# Patient Record
Sex: Female | Born: 1975 | Race: White | Hispanic: No | Smoking: Former smoker
Health system: Southern US, Community
[De-identification: ages and names within clinical notes are randomized; demographics above are authoritative.]

## PROBLEM LIST (undated history)

## (undated) DIAGNOSIS — B019 Varicella without complication: Secondary | ICD-10-CM

## (undated) DIAGNOSIS — T7840XA Allergy, unspecified, initial encounter: Secondary | ICD-10-CM

## (undated) HISTORY — PX: EYE SURGERY: SHX253

## (undated) HISTORY — DX: Allergy, unspecified, initial encounter: T78.40XA

## (undated) HISTORY — PX: COLONOSCOPY: SHX174

## (undated) HISTORY — DX: Varicella without complication: B01.9

## (undated) HISTORY — PX: BREAST BIOPSY: SHX20

---

## 2014-07-26 ENCOUNTER — Other Ambulatory Visit: Payer: Self-pay

## 2014-07-26 DIAGNOSIS — Z803 Family history of malignant neoplasm of breast: Secondary | ICD-10-CM

## 2014-07-26 DIAGNOSIS — Z1231 Encounter for screening mammogram for malignant neoplasm of breast: Secondary | ICD-10-CM

## 2014-08-02 DIAGNOSIS — J309 Allergic rhinitis, unspecified: Secondary | ICD-10-CM | POA: Insufficient documentation

## 2014-08-15 ENCOUNTER — Ambulatory Visit
Admission: RE | Admit: 2014-08-15 | Discharge: 2014-08-15 | Disposition: A | Discharge status: Home / Self Care | Source: Ambulatory Visit

## 2014-08-15 DIAGNOSIS — Z803 Family history of malignant neoplasm of breast: Secondary | ICD-10-CM

## 2014-08-15 DIAGNOSIS — Z1231 Encounter for screening mammogram for malignant neoplasm of breast: Secondary | ICD-10-CM

## 2016-02-23 ENCOUNTER — Other Ambulatory Visit: Payer: Self-pay

## 2016-02-23 DIAGNOSIS — Z1231 Encounter for screening mammogram for malignant neoplasm of breast: Secondary | ICD-10-CM

## 2016-03-12 ENCOUNTER — Other Ambulatory Visit: Payer: Self-pay

## 2016-03-12 ENCOUNTER — Ambulatory Visit
Admission: RE | Admit: 2016-03-12 | Discharge: 2016-03-12 | Disposition: A | Discharge status: Home / Self Care | Source: Ambulatory Visit

## 2016-03-12 DIAGNOSIS — N63 Unspecified lump in unspecified breast: Secondary | ICD-10-CM

## 2016-03-12 DIAGNOSIS — Z1231 Encounter for screening mammogram for malignant neoplasm of breast: Secondary | ICD-10-CM

## 2016-11-25 DIAGNOSIS — D225 Melanocytic nevi of trunk: Secondary | ICD-10-CM | POA: Insufficient documentation

## 2017-03-13 ENCOUNTER — Other Ambulatory Visit: Payer: Self-pay | Admitting: Family Medicine

## 2017-03-13 DIAGNOSIS — Z1231 Encounter for screening mammogram for malignant neoplasm of breast: Secondary | ICD-10-CM

## 2017-03-31 ENCOUNTER — Ambulatory Visit
Admission: RE | Admit: 2017-03-31 | Discharge: 2017-03-31 | Disposition: A | Discharge status: Home / Self Care | Source: Ambulatory Visit | Attending: Family Medicine | Admitting: Family Medicine

## 2017-03-31 DIAGNOSIS — Z1231 Encounter for screening mammogram for malignant neoplasm of breast: Secondary | ICD-10-CM

## 2018-03-13 ENCOUNTER — Other Ambulatory Visit: Payer: Self-pay | Admitting: Family Medicine

## 2018-03-13 DIAGNOSIS — Z1231 Encounter for screening mammogram for malignant neoplasm of breast: Secondary | ICD-10-CM

## 2018-04-06 ENCOUNTER — Ambulatory Visit
Admission: RE | Admit: 2018-04-06 | Discharge: 2018-04-06 | Disposition: A | Discharge status: Home / Self Care | Source: Ambulatory Visit | Attending: Family Medicine | Admitting: Family Medicine

## 2018-04-06 DIAGNOSIS — Z1231 Encounter for screening mammogram for malignant neoplasm of breast: Secondary | ICD-10-CM

## 2019-03-24 ENCOUNTER — Other Ambulatory Visit: Payer: Self-pay | Admitting: Family Medicine

## 2019-03-24 DIAGNOSIS — Z1231 Encounter for screening mammogram for malignant neoplasm of breast: Secondary | ICD-10-CM

## 2019-04-24 ENCOUNTER — Other Ambulatory Visit: Payer: Self-pay

## 2019-04-24 ENCOUNTER — Ambulatory Visit
Admission: RE | Admit: 2019-04-24 | Discharge: 2019-04-24 | Disposition: A | Discharge status: Home / Self Care | Source: Ambulatory Visit | Attending: Family Medicine | Admitting: Family Medicine

## 2019-04-24 DIAGNOSIS — Z1231 Encounter for screening mammogram for malignant neoplasm of breast: Secondary | ICD-10-CM

## 2019-10-29 HISTORY — PX: BREAST BIOPSY: SHX20

## 2020-03-21 ENCOUNTER — Telehealth: Payer: Self-pay | Admitting: Genetic Counselor

## 2020-03-21 NOTE — Telephone Encounter (Signed)
Returned Ms. Hoes's call regarding genetic counseling to test for a known familial BRCA variant. LVM requesting that she call back to set up an appointment.

## 2020-03-24 NOTE — Telephone Encounter (Addendum)
Called Monique Jefferson regarding her questions about genetic counseling and testing. Discussed that there may be a cost associated with both the genetic counseling appointment and the genetic testing. Reviewed the billing policies of the genetic testing laboratories that we use, specifically that it is possible to request an out of pocket estimate from the laboratory prior to ordering the genetic test to get a better idea of what she may have to pay for testing. Recommended that she speak with her insurance company regarding the possible costs associated with a genetic counseling appointment, and to determine if she would need a referral from her PCP.   Monique Jefferson will look into her insurance coverage/requirements for an appointment and will reach out again when she is ready to schedule an appointment.

## 2020-04-05 ENCOUNTER — Other Ambulatory Visit: Payer: Self-pay | Admitting: Family Medicine

## 2020-04-05 DIAGNOSIS — Z1231 Encounter for screening mammogram for malignant neoplasm of breast: Secondary | ICD-10-CM

## 2020-04-24 LAB — HM PAP SMEAR

## 2020-04-24 LAB — LIPID PANEL
Cholesterol: 182 (ref 0–200)
HDL: 79 — AB (ref 35–70)
LDL Cholesterol: 92
Triglycerides: 54 (ref 40–160)

## 2020-04-24 LAB — CBC: RBC: 4.76 (ref 3.87–5.11)

## 2020-04-24 LAB — CBC AND DIFFERENTIAL
HCT: 43 (ref 36–46)
Hemoglobin: 14.6 (ref 12.0–16.0)
Neutrophils Absolute: 3.6
Platelets: 217 10*3/uL (ref 150–400)
WBC: 5.9

## 2020-04-24 LAB — RESULTS CONSOLE HPV: CHL HPV: NEGATIVE

## 2020-04-25 ENCOUNTER — Other Ambulatory Visit: Payer: Self-pay

## 2020-04-25 ENCOUNTER — Ambulatory Visit

## 2020-04-25 ENCOUNTER — Ambulatory Visit
Admission: RE | Admit: 2020-04-25 | Discharge: 2020-04-25 | Disposition: A | Discharge status: Home / Self Care | Source: Ambulatory Visit | Attending: Family Medicine | Admitting: Family Medicine

## 2020-04-25 DIAGNOSIS — Z1231 Encounter for screening mammogram for malignant neoplasm of breast: Secondary | ICD-10-CM

## 2020-04-28 ENCOUNTER — Other Ambulatory Visit: Payer: Self-pay | Admitting: Family Medicine

## 2020-04-28 DIAGNOSIS — R928 Other abnormal and inconclusive findings on diagnostic imaging of breast: Secondary | ICD-10-CM

## 2020-05-03 ENCOUNTER — Other Ambulatory Visit: Payer: Self-pay

## 2020-05-03 ENCOUNTER — Other Ambulatory Visit: Payer: Self-pay | Admitting: Family Medicine

## 2020-05-03 ENCOUNTER — Ambulatory Visit
Admission: RE | Admit: 2020-05-03 | Discharge: 2020-05-03 | Disposition: A | Discharge status: Home / Self Care | Source: Ambulatory Visit | Attending: Family Medicine | Admitting: Family Medicine

## 2020-05-03 DIAGNOSIS — R928 Other abnormal and inconclusive findings on diagnostic imaging of breast: Secondary | ICD-10-CM

## 2020-05-03 DIAGNOSIS — R921 Mammographic calcification found on diagnostic imaging of breast: Secondary | ICD-10-CM

## 2020-05-09 ENCOUNTER — Other Ambulatory Visit: Payer: Self-pay

## 2020-05-09 ENCOUNTER — Ambulatory Visit
Admission: RE | Admit: 2020-05-09 | Discharge: 2020-05-09 | Disposition: A | Discharge status: Home / Self Care | Source: Ambulatory Visit | Attending: Family Medicine | Admitting: Family Medicine

## 2020-05-09 DIAGNOSIS — R921 Mammographic calcification found on diagnostic imaging of breast: Secondary | ICD-10-CM

## 2020-05-17 ENCOUNTER — Other Ambulatory Visit: Payer: Self-pay | Admitting: Family Medicine

## 2020-06-15 DIAGNOSIS — Z1379 Encounter for other screening for genetic and chromosomal anomalies: Secondary | ICD-10-CM | POA: Insufficient documentation

## 2021-01-25 DIAGNOSIS — IMO0002 Reserved for concepts with insufficient information to code with codable children: Secondary | ICD-10-CM

## 2021-01-25 DIAGNOSIS — Z961 Presence of intraocular lens: Secondary | ICD-10-CM

## 2021-01-25 DIAGNOSIS — M26609 Unspecified temporomandibular joint disorder, unspecified side: Secondary | ICD-10-CM | POA: Insufficient documentation

## 2021-01-25 HISTORY — DX: Reserved for concepts with insufficient information to code with codable children: IMO0002

## 2021-01-25 HISTORY — DX: Presence of intraocular lens: Z96.1

## 2021-05-01 ENCOUNTER — Other Ambulatory Visit: Payer: Self-pay | Admitting: Family Medicine

## 2021-05-01 DIAGNOSIS — Z1231 Encounter for screening mammogram for malignant neoplasm of breast: Secondary | ICD-10-CM

## 2021-05-09 ENCOUNTER — Other Ambulatory Visit: Payer: Self-pay

## 2021-05-09 ENCOUNTER — Ambulatory Visit
Admission: RE | Admit: 2021-05-09 | Discharge: 2021-05-09 | Disposition: A | Discharge status: Home / Self Care | Source: Ambulatory Visit | Attending: Family Medicine | Admitting: Family Medicine

## 2021-05-09 DIAGNOSIS — Z1231 Encounter for screening mammogram for malignant neoplasm of breast: Secondary | ICD-10-CM

## 2021-05-12 LAB — HM MAMMOGRAPHY

## 2021-05-14 ENCOUNTER — Other Ambulatory Visit: Payer: Self-pay | Admitting: Family Medicine

## 2021-05-14 DIAGNOSIS — R928 Other abnormal and inconclusive findings on diagnostic imaging of breast: Secondary | ICD-10-CM

## 2021-06-06 ENCOUNTER — Other Ambulatory Visit: Payer: Self-pay

## 2021-06-06 ENCOUNTER — Ambulatory Visit
Admission: RE | Admit: 2021-06-06 | Discharge: 2021-06-06 | Disposition: A | Discharge status: Home / Self Care | Source: Ambulatory Visit | Attending: Family Medicine | Admitting: Family Medicine

## 2021-06-06 DIAGNOSIS — R928 Other abnormal and inconclusive findings on diagnostic imaging of breast: Secondary | ICD-10-CM

## 2022-04-15 ENCOUNTER — Ambulatory Visit: Payer: 59 | Admitting: Family Medicine

## 2022-04-26 ENCOUNTER — Encounter: Payer: Self-pay | Admitting: Family Medicine

## 2022-05-02 ENCOUNTER — Ambulatory Visit (INDEPENDENT_AMBULATORY_CARE_PROVIDER_SITE_OTHER): Payer: 59 | Admitting: Family Medicine

## 2022-05-06 NOTE — Progress Notes (Signed)
Provider cancelled

## 2022-05-13 ENCOUNTER — Ambulatory Visit (INDEPENDENT_AMBULATORY_CARE_PROVIDER_SITE_OTHER): Payer: 59 | Admitting: Ophthalmology

## 2022-05-13 ENCOUNTER — Encounter (INDEPENDENT_AMBULATORY_CARE_PROVIDER_SITE_OTHER): Payer: Self-pay | Admitting: Ophthalmology

## 2022-05-13 DIAGNOSIS — H5213 Myopia, bilateral: Secondary | ICD-10-CM | POA: Insufficient documentation

## 2022-05-13 HISTORY — DX: Myopia, bilateral: H52.13

## 2022-05-13 NOTE — Assessment & Plan Note (Signed)
No holes or tears OU.  Myopia corrected by anterior segment ICL lenses placed posterior to the iris, Dr. Milbert Coulter of [facility]  No complications no lens cloudiness

## 2022-05-13 NOTE — Progress Notes (Signed)
05/13/2022     CHIEF COMPLAINT Patient presents for  Chief Complaint  Patient presents with   Retina Evaluation      HISTORY OF PRESENT ILLNESS: Monique Jefferson is a 46 y.o. female who presents to the clinic today for:   HPI     Retina Evaluation           Laterality: right eye   Associated Symptoms: Flashes and Floaters.  Negative for Distortion, Blind Spot, Pain, Redness, Photophobia, Glare, Trauma, Scalp Tenderness, Jaw Claudication, Shoulder/Hip pain, Fever, Weight Loss and Fatigue         Comments   NP- FU OCT OU (Last seen in 2018) Pt stated, "I do see FOL in the corner of my eyes, mostly in my left eye. My vision has been pretty well since my last visit. I had lens placed in my eyes so I would see FOL in the corners. I was seen with Dr. Zadie Rhine awhile back and I would see him pretty regularly but I was told that he doesn't accept [facility] so I had to search for another specialist. So I just wanted to follow up and make sure everything is okay." Pt confirms FOL and floaters in both eyes. Pt stated vision has been stable since last visit.  History of posterior chamber myopic corrective intraocular lenses anterior to the native lens, performed in [place] edited by Hurman Horn, MD on 05/13/2022 11:20 AM.      Referring physician: Curly Rim, MD [address] [place],  [place] [address]  HISTORICAL INFORMATION:   Selected notes from the [facility]: No current outpatient medications on file. (Ophthalmic Drugs)   No current facility-administered medications for this visit. (Ophthalmic Drugs)   No current outpatient medications on file. (Other)   No current facility-administered medications for this visit. (Other)      REVIEW OF SYSTEMS: ROS   Negative for: Constitutional, Gastrointestinal, Neurological, Skin, Genitourinary, Musculoskeletal, HENT, Endocrine, Cardiovascular, Eyes,  Respiratory, Psychiatric, Allergic/Imm, Heme/Lymph Last edited by Silvestre Moment on 05/13/2022 10:21 AM.       ALLERGIES Allergies  Allergen Reactions   Penicillins Swelling    Throat; as a child   Sulfa Antibiotics Rash    PAST MEDICAL HISTORY Past Medical History:  Diagnosis Date   Chicken pox    Past Surgical History:  Procedure Laterality Date   BREAST BIOPSY      FAMILY HISTORY Family History  Problem Relation Age of Onset   Hyperlipidemia Mother    Breast cancer Mother    Hypertension Father    Hyperlipidemia Father    Osteoarthritis Father    Cancer Father    Hearing loss Father    Cancer Brother    Cancer Maternal Grandmother    Early death Maternal Grandfather    Cancer Maternal Grandfather    Early death Paternal Grandmother    Hypertension Paternal Grandfather    Hyperlipidemia Paternal Grandfather    Heart attack Paternal Grandfather     SOCIAL HISTORY Social History   Tobacco Use   Smoking status: Former    Types: Cigarettes   Smokeless tobacco: Never  Substance Use Topics   Alcohol use: Not Currently   Drug use: Not Currently         OPHTHALMIC EXAM:  Base Eye Exam     Visual Acuity (ETDRS)  Right Left   Dist Datil 20/25 -1 20/20         Tonometry (Tonopen, 10:25 AM)       Right Left   Pressure 16 15         Pupils       Pupils APD   Right PERRL None   Left PERRL None         Visual Fields       Left Right    Full Full         Extraocular Movement       Right Left    Full Full         Neuro/Psych     Oriented x3: Yes         Dilation     Both eyes: 1.0% Mydriacyl, 2.5% Phenylephrine @ 10:25 AM           Slit Lamp and Fundus Exam     External Exam       Right Left   External Normal Normal         Slit Lamp Exam       Right Left   Lids/Lashes Normal Normal   Conjunctiva/Sclera White and quiet White and quiet   Cornea Clear Clear   Anterior Chamber Deep and quiet Deep and quiet    Iris Round and reactive Round and reactive   Lens Clear, with posterior chamber ICL in place Clear, with posterior chamber ICL in place, anterior to lens   Anterior Vitreous Normal Normal         Fundus Exam       Right Left   Posterior Vitreous Normal Normal   Disc Normal Normal   C/D Ratio 0.2 0.2   Macula Normal Normal   Vessels Normal Normal   Periphery Normal no holes or tears, 20 D, 25 D90 D exams Normal no holes or tears, 20 D, 25 D90 D exams            IMAGING AND PROCEDURES  Imaging and Procedures for 05/13/22  OCT, Retina - OU - Both Eyes       Right Eye Quality was good. Scan locations included subfoveal. Central Foveal Thickness: 303. Findings include normal foveal contour.   Left Eye Quality was good. Scan locations included subfoveal. Central Foveal Thickness: 298. Progression has been stable. Findings include normal foveal contour.      Color Fundus Photography Optos - OU - Both Eyes       Right Eye Progression has no prior data. Disc findings include normal observations. Macula : normal observations. Vessels : normal observations. Periphery : normal observations.   Left Eye Progression has no prior data. Disc findings include normal observations. Macula : normal observations. Vessels : normal observations. Periphery : normal observations.              ASSESSMENT/PLAN:  High myopia, both eyes No holes or tears OU.  Myopia corrected by anterior segment ICL lenses placed posterior to the iris, Dr. Milbert Coulter of [facility]  No complications no lens cloudiness       ICD-10-CM   1. High myopia, both eyes  H52.13 OCT, Retina - OU - Both Eyes    Color Fundus Photography Optos - OU - Both Eyes      1.  No retinal holes or tears OU  2.  3.  Ophthalmic Meds Ordered this visit:  No orders of the defined types were placed in this encounter.  Return in about 2 years (around 05/13/2024) for DILATE OU, COLOR FP, OCT.  There are no  Patient Instructions on file for this visit.   Explained the diagnoses, plan, and follow up with the patient and they expressed understanding.  Patient expressed understanding of the importance of proper follow up care.   Clent Demark Lorianne Malbrough M.D. Diseases & Surgery of the Retina and Vitreous Retina & Diabetic [place] 05/13/22     Abbreviations: M myopia (nearsighted); A astigmatism; H hyperopia (farsighted); P presbyopia; Mrx spectacle prescription;  CTL contact lenses; OD right eye; OS left eye; OU both eyes  XT exotropia; ET esotropia; PEK punctate epithelial keratitis; PEE punctate epithelial erosions; DES dry eye syndrome; MGD meibomian gland dysfunction; ATs artificial tears; PFAT's preservative free artificial tears; [place] nuclear sclerotic cataract; PSC posterior subcapsular cataract; ERM epi-retinal membrane; PVD posterior vitreous detachment; RD retinal detachment; DM diabetes mellitus; DR diabetic retinopathy; NPDR non-proliferative diabetic retinopathy; PDR proliferative diabetic retinopathy; CSME clinically significant macular edema; DME diabetic macular edema; dbh dot blot hemorrhages; CWS cotton wool spot; POAG primary open angle glaucoma; C/D cup-to-disc ratio; HVF humphrey visual field; GVF goldmann visual field; OCT optical coherence tomography; IOP intraocular pressure; BRVO Branch retinal vein occlusion; CRVO central retinal vein occlusion; CRAO central retinal artery occlusion; BRAO branch retinal artery occlusion; RT retinal tear; SB scleral buckle; PPV pars plana vitrectomy; VH Vitreous hemorrhage; PRP panretinal laser photocoagulation; IVK intravitreal kenalog; VMT vitreomacular traction; MH Macular hole;  NVD neovascularization of the disc; NVE neovascularization elsewhere; AREDS age related eye disease study; ARMD age related macular degeneration; POAG primary open angle glaucoma; EBMD epithelial/anterior basement membrane dystrophy; ACIOL anterior chamber intraocular lens; IOL  intraocular lens; PCIOL posterior chamber intraocular lens; Phaco/IOL phacoemulsification with intraocular lens placement; [place] photorefractive keratectomy; LASIK laser assisted in situ keratomileusis; HTN hypertension; DM diabetes mellitus; COPD chronic obstructive pulmonary disease

## 2022-05-17 ENCOUNTER — Encounter: Payer: Self-pay | Admitting: Family Medicine

## 2022-05-17 ENCOUNTER — Ambulatory Visit (INDEPENDENT_AMBULATORY_CARE_PROVIDER_SITE_OTHER): Payer: 59 | Admitting: Family Medicine

## 2022-05-17 VITALS — BP 116/83 | HR 87 | Temp 98.9°F | Ht 67.91 in | Wt 138.0 lb

## 2022-05-17 DIAGNOSIS — Z0000 Encounter for general adult medical examination without abnormal findings: Secondary | ICD-10-CM

## 2022-05-17 DIAGNOSIS — Z800 Family history of malignant neoplasm of digestive organs: Secondary | ICD-10-CM | POA: Diagnosis not present

## 2022-05-17 DIAGNOSIS — L989 Disorder of the skin and subcutaneous tissue, unspecified: Secondary | ICD-10-CM | POA: Diagnosis not present

## 2022-05-17 DIAGNOSIS — Z8249 Family history of ischemic heart disease and other diseases of the circulatory system: Secondary | ICD-10-CM

## 2022-05-17 DIAGNOSIS — Z1211 Encounter for screening for malignant neoplasm of colon: Secondary | ICD-10-CM | POA: Diagnosis not present

## 2022-05-17 DIAGNOSIS — Z130 Encounter for screening for diseases of the blood and blood-forming organs and certain disorders involving the immune mechanism: Secondary | ICD-10-CM

## 2022-05-17 DIAGNOSIS — Z7689 Persons encountering health services in other specified circumstances: Secondary | ICD-10-CM | POA: Diagnosis not present

## 2022-05-17 DIAGNOSIS — Z13220 Encounter for screening for lipoid disorders: Secondary | ICD-10-CM

## 2022-05-17 DIAGNOSIS — Z1231 Encounter for screening mammogram for malignant neoplasm of breast: Secondary | ICD-10-CM

## 2022-05-17 DIAGNOSIS — Z131 Encounter for screening for diabetes mellitus: Secondary | ICD-10-CM

## 2022-05-17 LAB — CBC WITH DIFFERENTIAL/PLATELET
Basophils Absolute: 0.1 10*3/uL (ref 0.0–0.1)
Basophils Relative: 1.1 % (ref 0.0–3.0)
Eosinophils Absolute: 0.1 10*3/uL (ref 0.0–0.7)
Eosinophils Relative: 2.6 % (ref 0.0–5.0)
HCT: 40.7 % (ref 36.0–46.0)
Hemoglobin: 13.6 g/dL (ref 12.0–15.0)
Lymphocytes Relative: 28.9 % (ref 12.0–46.0)
Lymphs Abs: 1.6 10*3/uL (ref 0.7–4.0)
MCHC: 33.5 g/dL (ref 30.0–36.0)
MCV: 92.4 fl (ref 78.0–100.0)
Monocytes Absolute: 0.5 10*3/uL (ref 0.1–1.0)
Monocytes Relative: 9.1 % (ref 3.0–12.0)
Neutro Abs: 3.2 10*3/uL (ref 1.4–7.7)
Neutrophils Relative %: 58.3 % (ref 43.0–77.0)
Platelets: 216 10*3/uL (ref 150.0–400.0)
RBC: 4.41 Mil/uL (ref 3.87–5.11)
RDW: 13.2 % (ref 11.5–15.5)
WBC: 5.6 10*3/uL (ref 4.0–10.5)

## 2022-05-17 LAB — COMPREHENSIVE METABOLIC PANEL
ALT: 10 U/L (ref 0–35)
AST: 15 U/L (ref 0–37)
Albumin: 4.5 g/dL (ref 3.5–5.2)
Alkaline Phosphatase: 60 U/L (ref 39–117)
BUN: 18 mg/dL (ref 6–23)
CO2: 29 mEq/L (ref 19–32)
Calcium: 9.3 mg/dL (ref 8.4–10.5)
Chloride: 103 mEq/L (ref 96–112)
Creatinine, Ser: 0.86 mg/dL (ref 0.40–1.20)
GFR: 81.4 mL/min (ref >60.00)
Glucose, Bld: 76 mg/dL (ref 70–99)
Potassium: 4.1 mEq/L (ref 3.5–5.1)
Sodium: 139 mEq/L (ref 135–145)
Total Bilirubin: 0.6 mg/dL (ref 0.2–1.2)
Total Protein: 6.9 g/dL (ref 6.0–8.3)

## 2022-05-17 LAB — LIPID PANEL
Cholesterol: 188 mg/dL (ref 0–200)
HDL: 67.9 mg/dL (ref >39.00)
LDL Cholesterol: 101 mg/dL — ABNORMAL HIGH (ref 0–99)
NonHDL: 120.1
Total CHOL/HDL Ratio: 3
Triglycerides: 97 mg/dL (ref 0.0–149.0)
VLDL: 19.4 mg/dL (ref 0.0–40.0)

## 2022-05-17 LAB — HEMOGLOBIN A1C: Hgb A1c MFr Bld: 5.6 % (ref 4.6–6.5)

## 2022-05-17 NOTE — Progress Notes (Signed)
Patient ID: Monique Jefferson, female  DOB: August 04, 1976, 46 y.o.   MRN: [id number] Patient Care Team    Relationship Specialty Notifications Start End  Ma Hillock, DO PCP - General Family Medicine  05/13/22   Zadie Rhine Clent Demark, MD Consulting Physician Ophthalmology  05/17/22     Chief Complaint  Patient presents with   Annual Exam    Pt is concerned about age spot on face but prefers to do CPE;ok to sch sep appt. Pt is not fasting    Subjective:  Monique Jefferson is a 46 y.o.  female present for new patient establishment. All past medical history, surgical history, allergies, family history, immunizations, medications and social history were updated in the electronic medical record today. All recent labs, ED visits and hospitalizations within the last year were reviewed.  Health maintenance:  Colonoscopy: Never.  Family history of colon cancer in her father.  Referral to gastroenterology placed today  Mammogram: Due.  Patient has had mammograms of [place].  She has had cysts and required repeat mammograms with ultrasounds the last 2 years-benign.  Order placed today.   Cervical cancer screening: last pap: 2021, results: Normal with negative cotest, completed by: PCP, 5 years Immunizations: tdap 05/2017, Influenza (encouraged yearly), COVID declined Infectious disease screening: HIV declined, Hep C declined DEXA: Routine screening  Assistive device: none Oxygen BTD:HRCB Patient has a Dental home. Hospitalizations/ED visits:     05/17/2022   10:23 AM  Depression screen PHQ 2/9  Decreased Interest 0  Down, Depressed, Hopeless 0  PHQ - 2 Score 0  Altered sleeping 0  Tired, decreased energy 0  Change in appetite 0  Feeling bad or failure about yourself  0  Trouble concentrating 0  Moving slowly or fidgety/restless 0  Suicidal thoughts 0  PHQ-9 Score 0  Difficult doing work/chores Not difficult at all      05/17/2022   10:24 AM  GAD 7 : Generalized  Anxiety Score  Nervous, Anxious, on Edge 0  Control/stop worrying 0  Worry too much - different things 0  Trouble relaxing 0  Restless 0  Easily annoyed or irritable 0  Afraid - awful might happen 0  Total GAD 7 Score 0  Anxiety Difficulty Not difficult at all           No data to display           Immunization History  Administered Date(s) Administered   Influenza Inj Mdck Quad Pf 09/02/2017   Influenza Nasal 07/28/2012   Influenza Split 08/25/2015   Influenza Whole 09/06/2011   Influenza, Seasonal, Injecte, Preservative Fre 08/12/2019   Influenza,Quad,Nasal, Live 07/28/2012   Influenza,inj,Quad PF,6-35 Mos 08/12/2019   Influenza,inj,quad, With Preservative 08/14/2018   Influenza-Unspecified 09/01/2014, 07/31/2016   PPD Test 08/31/2014   Tdap 06/24/2017    No results found.  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 01/25/2021   Allergies  Allergen Reactions   Penicillins Swelling    Throat; as a child   Sulfa Antibiotics Rash   Past Surgical History:  Procedure Laterality Date   BREAST BIOPSY  2021   Family History  Problem Relation Age of Onset   Hyperlipidemia Mother    Breast cancer Mother  Esophageal cancer Father 67   Hypertension Father    Hyperlipidemia Father    Osteoarthritis Father    Hearing loss Father    Prostate cancer Father 27   Prostate cancer Brother 11   Cervical cancer Maternal Grandmother    Early death Maternal Grandfather    Colon cancer Maternal Grandfather 46   Early death Paternal Grandmother    Hypertension Paternal Grandfather    Hyperlipidemia Paternal Grandfather    Heart attack Paternal Grandfather    Social History   Social History Narrative   Marital status/children/pets: Single (D),  G2P2   Education/employment: Employed, bachelor's of art degree.  Works as a [employer] of [employer].   Safety:      -smoke alarm in the home:Yes     - wears seatbelt: Yes     - Feels safe in their relationships: Yes       Allergies as of 05/17/2022       Reactions   Penicillins Swelling   Throat; as a child   Sulfa Antibiotics Rash        Medication List    as of May 17, 2022  5:57 PM   You have not been prescribed any medications.     All past medical history, surgical history, allergies, family history, immunizations andmedications were updated in the EMR today and reviewed under the history and medication portions of their EMR.    Recent Results (from the past 2160 hour(s))  CBC w/Diff     Status: None   Collection Time: 05/17/22 11:23 AM  Result Value Ref Range   WBC 5.6 4.0 - 10.5 K/uL   RBC 4.41 3.87 - 5.11 Mil/uL   Hemoglobin 13.6 12.0 - 15.0 g/dL   HCT 40.7 36.0 - 46.0 %   MCV 92.4 78.0 - 100.0 fl   MCHC 33.5 30.0 - 36.0 g/dL   RDW 13.2 11.5 - 15.5 %   Platelets 216.0 150.0 - 400.0 K/uL   Neutrophils Relative % 58.3 43.0 - 77.0 %   Lymphocytes Relative 28.9 12.0 - 46.0 %   Monocytes Relative 9.1 3.0 - 12.0 %   Eosinophils Relative 2.6 0.0 - 5.0 %   Basophils Relative 1.1 0.0 - 3.0 %   Neutro Abs 3.2 1.4 - 7.7 K/uL   Lymphs Abs 1.6 0.7 - 4.0 K/uL   Monocytes Absolute 0.5 0.1 - 1.0 K/uL   Eosinophils Absolute 0.1 0.0 - 0.7 K/uL   Basophils Absolute 0.1 0.0 - 0.1 K/uL  Comp Met (CMET)     Status: None   Collection Time: 05/17/22 11:23 AM  Result Value Ref Range   Sodium 139 135 - 145 mEq/L   Potassium 4.1 3.5 - 5.1 mEq/L   Chloride 103 96 - 112 mEq/L   CO2 29 19 - 32 mEq/L   Glucose, Bld 76 70 - 99 mg/dL   BUN 18 6 - 23 mg/dL   Creatinine, Ser 0.86 0.40 - 1.20 mg/dL   Total Bilirubin 0.6 0.2 - 1.2 mg/dL   Alkaline Phosphatase 60 39 - 117 U/L   AST 15 0 - 37 U/L   ALT 10 0 - 35 U/L   Total Protein 6.9 6.0 - 8.3 g/dL   Albumin 4.5  3.5 - 5.2 g/dL   GFR 81.40 >60.00 mL/min    Comment: Calculated using the CKD-EPI Creatinine Equation (2021)   Calcium 9.3 8.4 - 10.5 mg/dL  Hemoglobin A1c     Status: None   Collection Time: 05/17/22  11:23 AM  Result Value Ref Range   Hgb A1c MFr Bld 5.6 4.6 - 6.5 %    Comment: Glycemic Control Guidelines for People with Diabetes:Non Diabetic:  <6%Goal of Therapy: <7%Additional Action Suggested:  >8%   Lipid panel     Status: Abnormal   Collection Time: 05/17/22 11:23 AM  Result Value Ref Range   Cholesterol 188 0 - 200 mg/dL    Comment: ATP III Classification       Desirable:  < 200 mg/dL               Borderline High:  200 - 239 mg/dL          High:  > = 240 mg/dL   Triglycerides 97.0 0.0 - 149.0 mg/dL    Comment: Normal:  <150 mg/dLBorderline High:  150 - 199 mg/dL   HDL 67.90 >39.00 mg/dL   VLDL 19.4 0.0 - 40.0 mg/dL   LDL Cholesterol 101 (H) 0 - 99 mg/dL   Total CHOL/HDL Ratio 3     Comment:                Men          Women1/2 Average Risk     3.4          3.3Average Risk          5.0          4.42X Average Risk          9.6          7.13X Average Risk          15.0          11.0                       NonHDL 120.10     Comment: NOTE:  Non-HDL goal should be 30 mg/dL higher than patient's LDL goal (i.e. LDL goal of < 70 mg/dL, would have non-HDL goal of < 100 mg/dL)    MM DIAG BREAST TOMO UNI LEFT  Result Date: 06/06/2021 CLINICAL DATA:  Patient returns today to evaluate a possible LEFT breast mass identified on recent screening mammogram. History of benign LEFT breast biopsy in 2021 with pathology result of benign fibrocystic change, adenosis and PASH. EXAM: DIGITAL DIAGNOSTIC UNILATERAL LEFT MAMMOGRAM WITH TOMOSYNTHESIS AND CAD; ULTRASOUND LEFT BREAST LIMITED TECHNIQUE: Left digital diagnostic mammography and breast tomosynthesis was performed. The images were evaluated with computer-aided detection.; Targeted ultrasound examination of the left breast was performed. COMPARISON:   Previous exams including recent screening mammogram dated 05/09/2021. ACR Breast Density Category c: The breast tissue is heterogeneously dense, which may obscure small masses. FINDINGS: On today's additional diagnostic views with spot compression and 3D tomosynthesis, an oval circumscribed mass is confirmed within the upper LEFT breast, 11-12 o'clock axis region, at posterior depth, measuring approximately 8 mm greatest dimension. Targeted ultrasound is performed, showing a benign cyst within the LEFT breast at the 11 o'clock axis, 5 cm from the nipple, measuring 7 x 4 x 5 mm, corresponding to the mammographic finding. No suspicious solid or cystic mass is identified within the upper LEFT breast by ultrasound. There is an additional benign cyst in the LEFT breast at the 2 o'clock axis, 1 cm from the nipple, measuring 6 mm, corresponding as an incidental finding IMPRESSION: No evidence of malignancy. Benign cyst within the LEFT breast at the 11 o'clock axis, measuring 7 mm, corresponding to the mammographic finding. RECOMMENDATION: Screening  mammogram in one year.(Code:SM-B-01Y) I have discussed the findings and recommendations with the patient. If applicable, a reminder letter will be sent to the patient regarding the next appointment. BI-RADS CATEGORY  2: Benign. Electronically Signed   By: Franki Cabot M.D.   On: 06/06/2021 11:30   US BREAST LTD UNI LEFT INC AXILLA  Result Date: 06/06/2021 CLINICAL DATA:  Patient returns today to evaluate a possible LEFT breast mass identified on recent screening mammogram. History of benign LEFT breast biopsy in 2021 with pathology result of benign fibrocystic change, adenosis and PASH. EXAM: DIGITAL DIAGNOSTIC UNILATERAL LEFT MAMMOGRAM WITH TOMOSYNTHESIS AND CAD; ULTRASOUND LEFT BREAST LIMITED TECHNIQUE: Left digital diagnostic mammography and breast tomosynthesis was performed. The images were evaluated with computer-aided detection.; Targeted ultrasound examination of  the left breast was performed. COMPARISON:  Previous exams including recent screening mammogram dated 05/09/2021. ACR Breast Density Category c: The breast tissue is heterogeneously dense, which may obscure small masses. FINDINGS: On today's additional diagnostic views with spot compression and 3D tomosynthesis, an oval circumscribed mass is confirmed within the upper LEFT breast, 11-12 o'clock axis region, at posterior depth, measuring approximately 8 mm greatest dimension. Targeted ultrasound is performed, showing a benign cyst within the LEFT breast at the 11 o'clock axis, 5 cm from the nipple, measuring 7 x 4 x 5 mm, corresponding to the mammographic finding. No suspicious solid or cystic mass is identified within the upper LEFT breast by ultrasound. There is an additional benign cyst in the LEFT breast at the 2 o'clock axis, 1 cm from the nipple, measuring 6 mm, corresponding as an incidental finding IMPRESSION: No evidence of malignancy. Benign cyst within the LEFT breast at the 11 o'clock axis, measuring 7 mm, corresponding to the mammographic finding. RECOMMENDATION: Screening mammogram in one year.(Code:SM-B-01Y) I have discussed the findings and recommendations with the patient. If applicable, a reminder letter will be sent to the patient regarding the next appointment. BI-RADS CATEGORY  2: Benign. Electronically Signed   By: Franki Cabot M.D.   On: 06/06/2021 11:30    ROS 14 pt review of systems performed and negative (unless mentioned in an HPI)  Objective: BP 116/83   Pulse 87   Temp 98.9 F (37.2 C)   Ht 5' 7.91" (1.725 m)   Wt 138 lb (62.6 kg)   LMP 05/11/2022 (Approximate)   SpO2 100%   BMI 21.04 kg/m  Physical Exam Vitals and nursing note reviewed.  Constitutional:      General: She is not in acute distress.    Appearance: Normal appearance. She is not ill-appearing or toxic-appearing.  HENT:     Head: Normocephalic and atraumatic.     Right Ear: Tympanic membrane, ear  canal and external ear normal. There is no impacted cerumen.     Left Ear: Tympanic membrane, ear canal and external ear normal. There is no impacted cerumen.     Nose: No congestion or rhinorrhea.     Mouth/Throat:     Mouth: Mucous membranes are moist.     Pharynx: Oropharynx is clear. No oropharyngeal exudate or posterior oropharyngeal erythema.  Eyes:     General: No scleral icterus.       Right eye: No discharge.        Left eye: No discharge.     Extraocular Movements: Extraocular movements intact.     Conjunctiva/sclera: Conjunctivae normal.     Pupils: Pupils are equal, round, and reactive to light.  Cardiovascular:     Rate and  Rhythm: Normal rate and regular rhythm.     Pulses: Normal pulses.     Heart sounds: Normal heart sounds. No murmur heard.    No friction rub. No gallop.  Pulmonary:     Effort: Pulmonary effort is normal. No respiratory distress.     Breath sounds: Normal breath sounds. No stridor. No wheezing, rhonchi or rales.  Chest:     Chest wall: No tenderness.  Abdominal:     General: Abdomen is flat. Bowel sounds are normal. There is no distension.     Palpations: Abdomen is soft. There is no mass.     Tenderness: There is no abdominal tenderness. There is no right CVA tenderness, left CVA tenderness, guarding or rebound.     Hernia: No hernia is present.  Musculoskeletal:        General: No swelling, tenderness or deformity. Normal range of motion.     Cervical back: Normal range of motion and neck supple. No rigidity or tenderness.     Right lower leg: No edema.     Left lower leg: No edema.  Lymphadenopathy:     Cervical: No cervical adenopathy.  Skin:    General: Skin is warm and dry.     Coloration: Skin is not jaundiced or pale.     Findings: Lesion present. No bruising, erythema or rash.     Comments: 44mm mild raised lightly pigmented lesion right lateral zygomatic arch .  Neurological:     General: No focal deficit present.     Mental Status:  She is alert and oriented to person, place, and time. Mental status is at baseline.     Cranial Nerves: No cranial nerve deficit.     Sensory: No sensory deficit.     Motor: No weakness.     Coordination: Coordination normal.     Gait: Gait normal.     Deep Tendon Reflexes: Reflexes normal.  Psychiatric:        Mood and Affect: Mood normal.        Behavior: Behavior normal.        Thought Content: Thought content normal.        Judgment: Judgment normal.         Assessment/plan: Monique Jefferson is a 46 y.o. female present for est care- CPE Establishing care with new doctor, encounter for FH: heart disease/lipid screening Lipid panel collected today Screening for deficiency anemia - CBC w/Diff Diabetes mellitus screening - Comp Met (CMET) - Hemoglobin A1c FH: colon cancer/colon cancer screening Referral to gastroenterology today Breast cancer screening by mammogram - MM 3D SCREEN BREAST BILATERAL; Future Skin lesion: Macular papular lesion of face, may be undergoing some change. Dermatology referral placed today Routine general medical examination at a health care facility Colonoscopy: Never.  Family history of colon cancer in her father.  Referral to gastroenterology placed today  Mammogram: Due.  Patient has had mammograms of [place].  She has had cysts and required repeat mammograms with ultrasounds the last 2 years-benign.  Order placed today.   Cervical cancer screening: last pap: 2021, results: Normal with negative cotest, completed by: PCP, 5 years Immunizations: tdap 05/2017, Influenza (encouraged yearly), COVID declined Infectious disease screening: HIV declined, Hep C declined DEXA: Routine screening  Patient was encouraged to exercise greater than 150 minutes a week. Patient was encouraged to choose a diet filled with fresh fruits and vegetables, and lean meats. AVS provided to patient today for education/recommendation on gender specific health  and safety maintenance. Return in about 1 year (around 05/19/2023) for cpe (20 min).  Orders Placed This Encounter  Procedures   MM 3D SCREEN BREAST BILATERAL   CBC w/Diff   Comp Met (CMET)   Hemoglobin A1c   Lipid panel   Ambulatory referral to Gastroenterology   Ambulatory referral to Dermatology   No orders of the defined types were placed in this encounter.  Referral Orders         Ambulatory referral to Gastroenterology         Ambulatory referral to Dermatology       Note is dictated utilizing voice recognition software. Although note has been proof read prior to signing, occasional typographical errors still can be missed. If any questions arise, please do not hesitate to call for verification.  Electronically signed by: Howard Pouch, DO [facility]

## 2022-05-17 NOTE — Patient Instructions (Addendum)
No follow-ups on file.        Great to see you today.  I have refilled the medication(s) we provide.   If labs were collected, we will inform you of lab results once received either by echart message or telephone call.   - echart message- for normal results that have been seen by the patient already.   - telephone call: abnormal results or if patient has not viewed results in their echart.  [facility] Address:  [address], [place], [place] [address] Phone: [phone]    Health Maintenance, Female Adopting a healthy lifestyle and getting preventive care are important in promoting health and wellness. Ask your health care provider about: The right schedule for you to have regular tests and exams. Things you can do on your own to prevent diseases and keep yourself healthy. What should I know about diet, weight, and exercise? Eat a healthy diet  Eat a diet that includes plenty of vegetables, fruits, low-fat dairy products, and lean protein. Do not eat a lot of foods that are high in solid fats, added sugars, or sodium. Maintain a healthy weight Body mass index (BMI) is used to identify weight problems. It estimates body fat based on height and weight. Your health care provider can help determine your BMI and help you achieve or maintain a healthy weight. Get regular exercise Get regular exercise. This is one of the most important things you can do for your health. Most adults should: Exercise for at least 150 minutes each week. The exercise should increase your heart rate and make you sweat (moderate-intensity exercise). Do strengthening exercises at least twice a week. This is in addition to the moderate-intensity exercise. Spend less time sitting. Even light physical activity can be beneficial. Watch cholesterol and blood lipids Have your blood tested for lipids and cholesterol at 46 years of age, then have this test every 5 years. Have your cholesterol  levels checked more often if: Your lipid or cholesterol levels are high. You are older than 46 years of age. You are at high risk for heart disease. What should I know about cancer screening? Depending on your health history and family history, you may need to have cancer screening at various ages. This may include screening for: Breast cancer. Cervical cancer. Colorectal cancer. Skin cancer. Lung cancer. What should I know about heart disease, diabetes, and high blood pressure? Blood pressure and heart disease High blood pressure causes heart disease and increases the risk of stroke. This is more likely to develop in people who have high blood pressure readings or are overweight. Have your blood pressure checked: Every 3-5 years if you are 40-59 years of age. Every year if you are 74 years old or older. Diabetes Have regular diabetes screenings. This checks your fasting blood sugar level. Have the screening done: Once every three years after age 32 if you are at a normal weight and have a low risk for diabetes. More often and at a younger age if you are overweight or have a high risk for diabetes. What should I know about preventing infection? Hepatitis B If you have a higher risk for hepatitis B, you should be screened for this virus. Talk with your health care provider to find out if you are at risk for hepatitis B infection. Hepatitis C Testing is recommended for: Everyone born from [birth year] through 1965. Anyone with known risk factors for hepatitis C. Sexually transmitted infections (STIs) Get screened for STIs, including gonorrhea  and chlamydia, if: You are sexually active and are younger than 46 years of age. You are older than 46 years of age and your health care provider tells you that you are at risk for this type of infection. Your sexual activity has changed since you were last screened, and you are at increased risk for chlamydia or gonorrhea. Ask your health care provider if  you are at risk. Ask your health care provider about whether you are at high risk for HIV. Your health care provider may recommend a prescription medicine to help prevent HIV infection. If you choose to take medicine to prevent HIV, you should first get tested for HIV. You should then be tested every 3 months for as long as you are taking the medicine. Pregnancy If you are about to stop having your period (premenopausal) and you may become pregnant, seek counseling before you get pregnant. Take 400 to 800 micrograms (mcg) of folic acid every day if you become pregnant. Ask for birth control (contraception) if you want to prevent pregnancy. Osteoporosis and menopause Osteoporosis is a disease in which the bones lose minerals and strength with aging. This can result in bone fractures. If you are 21 years old or older, or if you are at risk for osteoporosis and fractures, ask your health care provider if you should: Be screened for bone loss. Take a calcium or vitamin D supplement to lower your risk of fractures. Be given hormone replacement therapy (HRT) to treat symptoms of menopause. Follow these instructions at home: Alcohol use Do not drink alcohol if: Your health care provider tells you not to drink. You are pregnant, may be pregnant, or are planning to become pregnant. If you drink alcohol: Limit how much you have to: 0-1 drink a day. Know how much alcohol is in your drink. In the [place] one drink equals one 12 oz bottle of beer (355 mL), one 5 oz glass of wine (148 mL), or one 1 oz glass of hard liquor (44 mL). Lifestyle Do not use any products that contain nicotine or tobacco. These products include cigarettes, chewing tobacco, and vaping devices, such as e-cigarettes. If you need help quitting, ask your health care provider. Do not use street drugs. Do not share needles. Ask your health care provider for help if you need support or information about quitting drugs. General  instructions Schedule regular health, dental, and eye exams. Stay current with your vaccines. Tell your health care provider if: You often feel depressed. You have ever been abused or do not feel safe at home. Summary Adopting a healthy lifestyle and getting preventive care are important in promoting health and wellness. Follow your health care provider's instructions about healthy diet, exercising, and getting tested or screened for diseases. Follow your health care provider's instructions on monitoring your cholesterol and blood pressure. This information is not intended to replace advice given to you by your health care provider. Make sure you discuss any questions you have with your health care provider. Document Revised: 03/05/2021 Document Reviewed: 03/05/2021 Elsevier Patient Education  [place].

## 2022-05-30 ENCOUNTER — Ambulatory Visit (AMBULATORY_SURGERY_CENTER): Payer: 59 | Admitting: *Deleted

## 2022-05-30 ENCOUNTER — Telehealth: Payer: Self-pay | Admitting: *Deleted

## 2022-05-30 VITALS — Ht 67.0 in | Wt 138.0 lb

## 2022-05-30 DIAGNOSIS — Z1211 Encounter for screening for malignant neoplasm of colon: Secondary | ICD-10-CM

## 2022-05-30 DIAGNOSIS — Z800 Family history of malignant neoplasm of digestive organs: Secondary | ICD-10-CM

## 2022-05-30 MED ORDER — NA SULFATE-K SULFATE-MG SULF 17.5-3.13-1.6 GM/177ML PO SOLN: 1.0000 | Freq: Once | ORAL | 0 refills | Status: AC

## 2022-05-30 NOTE — Telephone Encounter (Signed)
Attempted to contact pharmacy to verify if received rx for suprep x 4 disconnected each time, notified  pt. About issue and she is going to verify with pharmacy she stated "if I have any problems I will let you know.

## 2022-05-30 NOTE — Progress Notes (Signed)
No egg or soy allergy known to patient  No issues known to pt with past sedation with any surgeries or procedures Patient denies ever being told they had issues or difficulty with intubation  No FH of Malignant Hyperthermia Pt is not on diet pills Pt is not on  home 02  Pt is not on blood thinners  Pt denies issues with constipation  No A fib or A flutter Have any cardiac testing pending--no Pt instructed to use Singlecare.com or GoodRx for a price reduction on prep   

## 2022-05-31 ENCOUNTER — Encounter: Payer: Self-pay | Admitting: Certified Registered Nurse Anesthetist

## 2022-05-31 MED ORDER — NA SULFATE-K SULFATE-MG SULF 17.5-3.13-1.6 GM/177ML PO SOLN: 1.0000 | Freq: Once | ORAL | 0 refills | Status: AC

## 2022-05-31 NOTE — Telephone Encounter (Signed)
Prep sent into pharmacy per pt.request,made pt. Aware prep sent in.

## 2022-05-31 NOTE — Telephone Encounter (Signed)
Patient called stating that the reason that the pharmacy could not be reached was due to the power being out. Patient is wanting prep medication sent to:  [facility]  [phone]

## 2022-06-04 ENCOUNTER — Telehealth: Payer: Self-pay | Admitting: Gastroenterology

## 2022-06-04 ENCOUNTER — Encounter: Payer: Self-pay | Admitting: Gastroenterology

## 2022-06-04 NOTE — Telephone Encounter (Signed)
PT needs to have prep instructions re sent to her. Tried to show her where it would be in [facility] and said it isnt there. Please reach out to advise. Thank you.

## 2022-06-04 NOTE — Telephone Encounter (Signed)
Resent instructions to my chart- called pt and explained how to get to letters for instructions- pt verbalized understanding

## 2022-06-07 ENCOUNTER — Ambulatory Visit (AMBULATORY_SURGERY_CENTER): Payer: 59 | Admitting: Gastroenterology

## 2022-06-07 ENCOUNTER — Encounter: Payer: Self-pay | Admitting: Gastroenterology

## 2022-06-07 VITALS — BP 115/86 | HR 78 | Temp 98.6°F | Resp 12 | Ht 67.0 in | Wt 138.0 lb

## 2022-06-07 DIAGNOSIS — D122 Benign neoplasm of ascending colon: Secondary | ICD-10-CM

## 2022-06-07 DIAGNOSIS — D125 Benign neoplasm of sigmoid colon: Secondary | ICD-10-CM | POA: Diagnosis not present

## 2022-06-07 DIAGNOSIS — K635 Polyp of colon: Secondary | ICD-10-CM

## 2022-06-07 DIAGNOSIS — Z1211 Encounter for screening for malignant neoplasm of colon: Secondary | ICD-10-CM | POA: Diagnosis not present

## 2022-06-07 DIAGNOSIS — D123 Benign neoplasm of transverse colon: Secondary | ICD-10-CM

## 2022-06-07 MED ORDER — SODIUM CHLORIDE 0.9 % IV SOLN: 500.0000 mL | Freq: Once | INTRAVENOUS | Status: DC

## 2022-06-07 NOTE — Progress Notes (Unsigned)
Report given to PACU, vss 

## 2022-06-07 NOTE — Progress Notes (Unsigned)
Pt's states no medical or surgical changes since previsit or office visit. 

## 2022-06-07 NOTE — Progress Notes (Unsigned)
History and Physical:  This patient presents for endoscopic testing for: Encounter Diagnoses  Name Primary?   Family history of malignant neoplasm of gastrointestinal tract Yes   Special screening for malignant neoplasms, colon     Average risk - first screening exam. Patient denies chronic abdominal pain, rectal bleeding, constipation or diarrhea.   Patient is otherwise without complaints or active issues today.   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   Current Facility-Administered Medications  Medication Dose Route Frequency Provider Last Rate Last Admin   0.9 %  sodium chloride infusion  500 mL Intravenous Once Doran Stabler, MD          ___________________________________________________________________ Objective   Exam:  BP 123/89   Pulse 66   Temp 98.6 F (37 C)   Ht '5\' 7"'$  (1.702 m)   Wt 138 lb (62.6 kg)   LMP 05/11/2022 (Approximate)   SpO2 100%   BMI 21.61 kg/m   CV: RRR without murmur, S1/S2 Resp: clear to auscultation bilaterally, normal RR and effort noted GI: soft, no tenderness, with active bowel sounds.   Assessment: Encounter Diagnoses  Name Primary?   Family history of malignant neoplasm of gastrointestinal tract Yes   Special screening for malignant neoplasms, colon      Plan: Colonoscopy  The benefits and risks of the planned procedure were described  in detail with the patient or (when appropriate) their health care proxy.  Risks were outlined as including, but not limited to, bleeding, infection, perforation, adverse medication reaction leading to cardiac or pulmonary decompensation, pancreatitis (if ERCP).  The limitation of incomplete mucosal visualization was also discussed.  No guarantees or warranties were given.    The patient is appropriate for an endoscopic procedure in the ambulatory setting.   - Wilfrid Lund, MD

## 2022-06-07 NOTE — Patient Instructions (Signed)
  Handout on polyps given  YOU HAD AN ENDOSCOPIC PROCEDURE TODAY AT [facility]:   Refer to the procedure report that was given to you for any specific questions about what was found during the examination.  If the procedure report does not answer your questions, please call your gastroenterologist to clarify.  If you requested that your care partner not be given the details of your procedure findings, then the procedure report has been included in a sealed envelope for you to review at your convenience later.  YOU SHOULD EXPECT: Some feelings of bloating in the abdomen. Passage of more gas than usual.  Walking can help get rid of the air that was put into your GI tract during the procedure and reduce the bloating. If you had a lower endoscopy (such as a colonoscopy or flexible sigmoidoscopy) you may notice spotting of blood in your stool or on the toilet paper. If you underwent a bowel prep for your procedure, you may not have a normal bowel movement for a few days.  Please Note:  You might notice some irritation and congestion in your nose or some drainage.  This is from the oxygen used during your procedure.  There is no need for concern and it should clear up in a day or so.  SYMPTOMS TO REPORT IMMEDIATELY:  Following lower endoscopy (colonoscopy or flexible sigmoidoscopy):  Excessive amounts of blood in the stool  Significant tenderness or worsening of abdominal pains  Swelling of the abdomen that is new, acute  Fever of 100F or higher   For urgent or emergent issues, a gastroenterologist can be reached at any hour by calling [phone]. Do not use MyChart messaging for urgent concerns.    DIET:  We do recommend a small meal at first, but then you may proceed to your regular diet.  Drink plenty of fluids but you should avoid alcoholic beverages for 24 hours.  ACTIVITY:  You should plan to take it easy for the rest of today and you should NOT DRIVE or use heavy  machinery until tomorrow (because of the sedation medicines used during the test).    FOLLOW UP: Our staff will call the number listed on your records the next business day following your procedure.  We will call around 7:15- 8:00 am to check on you and address any questions or concerns that you may have regarding the information given to you following your procedure. If we do not reach you, we will leave a message.  If you develop any symptoms (ie: fever, flu-like symptoms, shortness of breath, cough etc.) before then, please call [phone].  If you test positive for Covid 19 in the 2 weeks post procedure, please call and report this information to [place].    If any biopsies were taken you will be contacted by phone or by letter within the next 1-3 weeks.  Please call us at [phone] if you have not heard about the biopsies in 3 weeks.    SIGNATURES/CONFIDENTIALITY: You and/or your care partner have signed paperwork which will be entered into your electronic medical record.  These signatures attest to the fact that that the information above on your After Visit Summary has been reviewed and is understood.  Full responsibility of the confidentiality of this discharge information lies with you and/or your care-partner.

## 2022-06-07 NOTE — Op Note (Signed)
Monique Jefferson: Monique Jefferson Procedure Date: 06/07/2022 2:53 PM MRN: [id number] Endoscopist: Roberts. Loletha Carrow , MD Age: 46 Referring MD:  Date of Birth: 26-Jun-1976 Gender: Female Account #: [id number] Procedure:                Colonoscopy Indications:              Screening for colorectal malignant neoplasm, This                            is the patient's first colonoscopy Medicines:                Monitored Anesthesia Care Procedure:                Pre-Anesthesia Assessment:                           - Prior to the procedure, a History and Physical                            was performed, and patient medications and                            allergies were reviewed. The patient's tolerance of                            previous anesthesia was also reviewed. The risks                            and benefits of the procedure and the sedation                            options and risks were discussed with the patient.                            All questions were answered, and informed consent                            was obtained. Prior Anticoagulants: The patient has                            taken no previous anticoagulant or antiplatelet                            agents. ASA Grade Assessment: I - A normal, healthy                            patient. After reviewing the risks and benefits,                            the patient was deemed in satisfactory condition to                            undergo the procedure.  After obtaining informed consent, the colonoscope                            was passed under direct vision. Throughout the                            procedure, the patient's blood pressure, pulse, and                            oxygen saturations were monitored continuously. The                            PCF-HQ190L Colonoscope was introduced through the                            anus and advanced to the the cecum,  identified by                            appendiceal orifice and ileocecal valve. The                            colonoscopy was somewhat difficult due to a                            redundant colon. Successful completion of the                            procedure was aided by using manual pressure and                            straightening and shortening the scope to obtain                            bowel loop reduction. The patient tolerated the                            procedure well. The quality of the bowel                            preparation was excellent. The ileocecal valve,                            appendiceal orifice, and rectum were photographed. Scope In: 2:57:54 PM Scope Out: 3:22:54 PM Scope Withdrawal Time: 0 hours 21 minutes 16 seconds  Total Procedure Duration: 0 hours 25 minutes 0 seconds  Findings:                 The perianal and digital rectal examinations were                            normal.                           An 8-10 mm polyp was found in the ascending colon.  The polyp was semi-sessile. The polyp was removed                            with a cold snare. Resection and retrieval were                            complete.                           A 20 mm polyp was found in the proximal transverse                            colon. The polyp was semi-sessile. The polyp was                            removed with a piecemeal technique using a cold                            snare. Resection and retrieval were complete.                           Two semi-sessile polyps were found in the proximal                            transverse colon. The polyps were 4 to 10 mm in                            size. These polyps were removed with a cold snare.                            Resection and retrieval were complete.                           A 6 mm polyp was found in the sigmoid colon. The                            polyp was  flat. The polyp was removed with a cold                            snare. Resection and retrieval were complete.                           Repeat examination of right colon and transverse                            under NBI performed.                           The exam was otherwise without abnormality on                            direct and retroflexion views. Complications:            No immediate complications. Estimated  Blood Loss:     Estimated blood loss was minimal. Impression:               - One 8-10 mm polyp in the ascending colon, removed                            with a cold snare. Resected and retrieved.                           - One 20 mm polyp in the proximal transverse colon,                            removed piecemeal using a cold snare. Resected and                            retrieved.                           - Two 4 to 10 mm polyps in the proximal transverse                            colon, removed with a cold snare. Resected and                            retrieved.                           - One 6 mm polyp in the sigmoid colon, removed with                            a cold snare. Resected and retrieved.                           - The examination was otherwise normal on direct                            and retroflexion views. Recommendation:           - Patient has a contact number available for                            emergencies. The signs and symptoms of potential                            delayed complications were discussed with the                            patient. Return to normal activities tomorrow.                            Written discharge instructions were provided to the                            patient.                           -  Resume previous diet.                           - Continue present medications.                           - Await pathology results.                           - Repeat colonoscopy is recommended for                             surveillance. The colonoscopy date will be                            determined after pathology results from today's                            exam become available for review. (if right colon                            polyps are SSP as they appear to be, repeat                            colonoscopy in 1 year) Mallie Mussel L. Loletha Carrow, MD 06/07/2022 3:30:15 PM This report has been signed electronically.

## 2022-06-07 NOTE — Progress Notes (Unsigned)
Called to room to assist during endoscopic procedure.  Patient ID and intended procedure confirmed with present staff. Received instructions for my participation in the procedure from the performing physician.  

## 2022-06-10 ENCOUNTER — Ambulatory Visit
Admission: RE | Admit: 2022-06-10 | Discharge: 2022-06-10 | Disposition: A | Discharge status: Home / Self Care | Payer: 59 | Source: Ambulatory Visit | Attending: Family Medicine | Admitting: Family Medicine

## 2022-06-10 ENCOUNTER — Telehealth: Payer: Self-pay | Admitting: *Deleted

## 2022-06-10 DIAGNOSIS — Z1231 Encounter for screening mammogram for malignant neoplasm of breast: Secondary | ICD-10-CM | POA: Diagnosis not present

## 2022-06-10 NOTE — Telephone Encounter (Signed)
No amswer for post procedure call back. Left VM.

## 2022-06-12 ENCOUNTER — Encounter: Payer: Self-pay | Admitting: Gastroenterology

## 2022-08-27 DIAGNOSIS — L570 Actinic keratosis: Secondary | ICD-10-CM | POA: Diagnosis not present

## 2022-08-27 DIAGNOSIS — L218 Other seborrheic dermatitis: Secondary | ICD-10-CM | POA: Diagnosis not present

## 2023-03-26 ENCOUNTER — Encounter: Payer: Self-pay | Admitting: Gastroenterology

## 2023-04-24 ENCOUNTER — Encounter: Payer: Self-pay | Admitting: Gastroenterology

## 2023-05-14 ENCOUNTER — Encounter (INDEPENDENT_AMBULATORY_CARE_PROVIDER_SITE_OTHER): Payer: 59 | Admitting: Ophthalmology

## 2023-05-14 ENCOUNTER — Encounter (INDEPENDENT_AMBULATORY_CARE_PROVIDER_SITE_OTHER): Payer: Self-pay

## 2023-06-06 ENCOUNTER — Encounter: Payer: Self-pay | Admitting: Gastroenterology

## 2023-06-06 ENCOUNTER — Ambulatory Visit (AMBULATORY_SURGERY_CENTER): Payer: 59 | Admitting: *Deleted

## 2023-06-06 VITALS — Ht 67.0 in | Wt 140.0 lb

## 2023-06-06 DIAGNOSIS — Z85038 Personal history of other malignant neoplasm of large intestine: Secondary | ICD-10-CM

## 2023-06-06 DIAGNOSIS — Z800 Family history of malignant neoplasm of digestive organs: Secondary | ICD-10-CM

## 2023-06-06 MED ORDER — NA SULFATE-K SULFATE-MG SULF 17.5-3.13-1.6 GM/177ML PO SOLN: 1.0000 | Freq: Once | ORAL | 0 refills | Status: AC

## 2023-06-06 NOTE — Progress Notes (Signed)
Pt's name and DOB verified at the beginning of the pre-visit.  Pt denies any difficulty with ambulating,sitting, laying down or rolling side to side Gave both [facility] main # and MD on call # prior to instructions.  No egg or soy allergy known to patient  No issues known to pt with past sedation with any surgeries or procedures Patient denies ever being intubated Pt has no issues moving head neck or swallowing No FH of Malignant Hyperthermia Pt is not on diet pills Pt is not on home 02  Pt is not on blood thinners  Pt denies issues with constipation  Pt has frequent issues with constipation RN instructed pt to use Miralax per bottles instructions a week before prep days. Pt states they will Pt is not on dialysis Pt denise any abnormal heart rhythms  Pt denies any upcoming cardiac testing Pt encouraged to use to use Singlecare or Goodrx to reduce cost  Patient's chart reviewed by Cathlyn Parsons CNRA prior to pre-visit and patient appropriate for the [facility].  Pre-visit completed and red dot placed by patient's name on their procedure day (on provider's schedule).  . Visit by phone Pt states weight is 140 lb Instructed pt why it is important to and  to call if they have any changes in health or new medications. Directed them to the # given and on instructions.   Pt states they will.  Instructions reviewed with pt and pt states understanding. Instructed to review again prior to procedure. Pt states they will.  Instructions sent by mail with coupon and by my chart

## 2023-06-09 ENCOUNTER — Other Ambulatory Visit: Payer: Self-pay | Admitting: Family Medicine

## 2023-06-09 DIAGNOSIS — Z1231 Encounter for screening mammogram for malignant neoplasm of breast: Secondary | ICD-10-CM

## 2023-06-13 ENCOUNTER — Ambulatory Visit
Admission: RE | Admit: 2023-06-13 | Discharge: 2023-06-13 | Disposition: A | Discharge status: Home / Self Care | Payer: 59 | Source: Ambulatory Visit | Attending: Family Medicine | Admitting: Family Medicine

## 2023-06-13 DIAGNOSIS — Z1231 Encounter for screening mammogram for malignant neoplasm of breast: Secondary | ICD-10-CM

## 2023-06-23 IMAGING — MG MM DIGITAL SCREENING BILAT W/ TOMO AND CAD
8 series · 9 of 24 positions shown · non-contrast
Comparison: Previous exam(s).

CLINICAL DATA: Screening.

EXAM:
DIGITAL SCREENING BILATERAL MAMMOGRAM WITH TOMOSYNTHESIS AND CAD
TECHNIQUE: Bilateral screening digital craniocaudal and mediolateral oblique
mammograms were obtained. Bilateral screening digital breast
tomosynthesis was performed. The images were evaluated with
computer-aided detection.

[L MLO synth-2D]
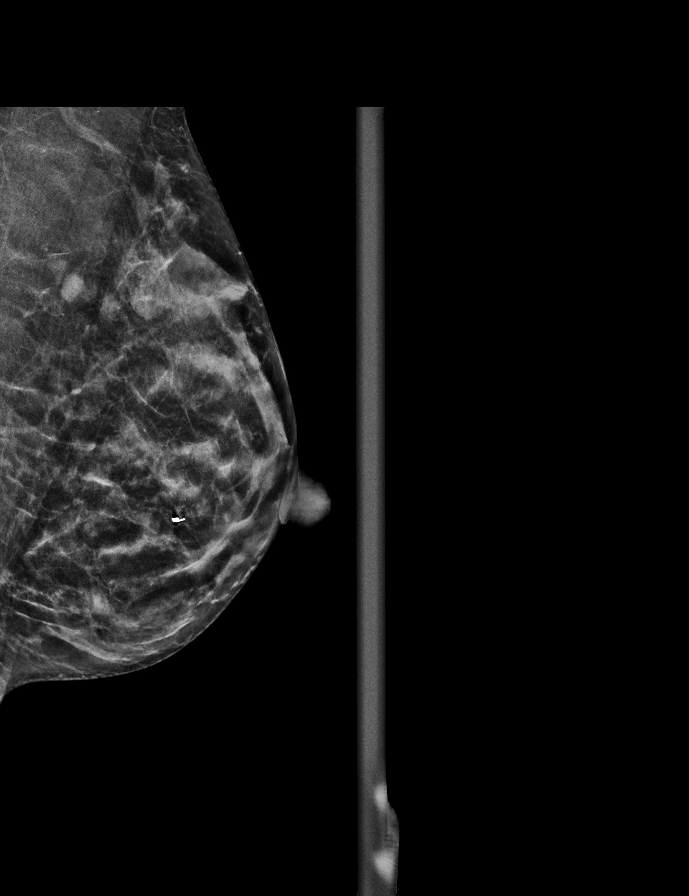

[R CC synth-2D]
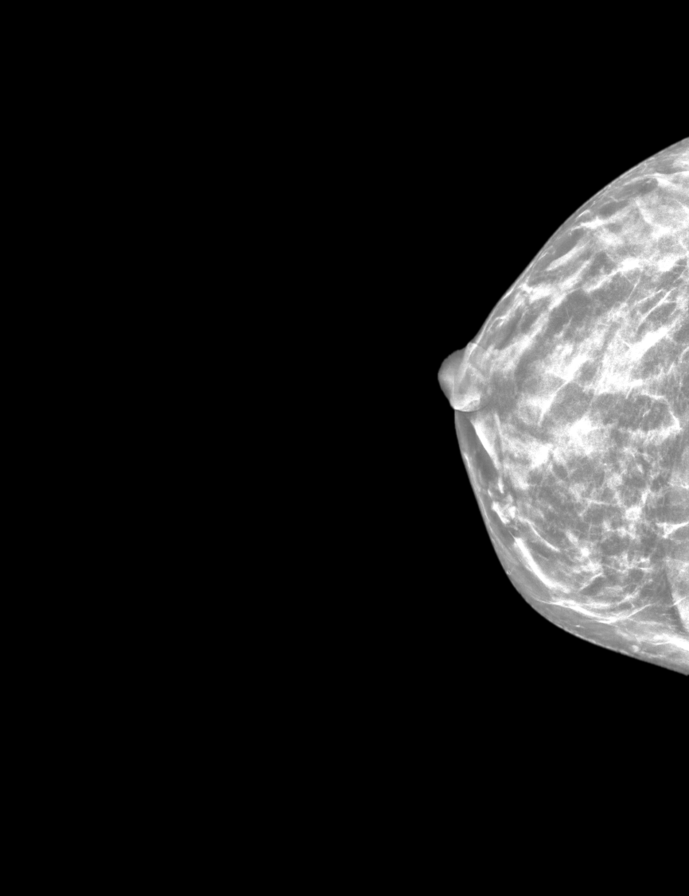

[L CC synth-2D]
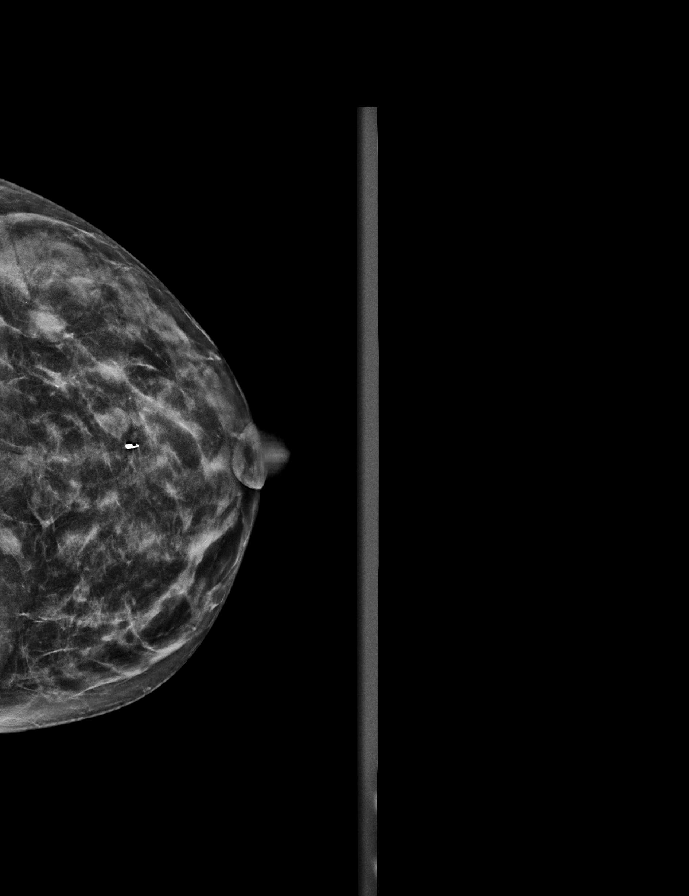

[R MLO synth-2D]
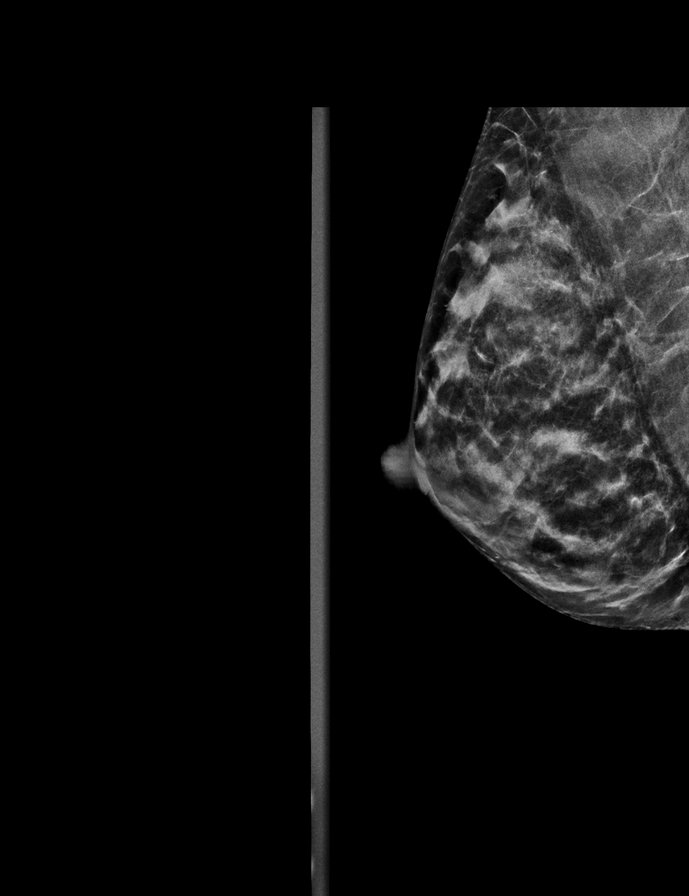

[R MLO tomo · 2 of 49 frames shown]
[frame 16/49]
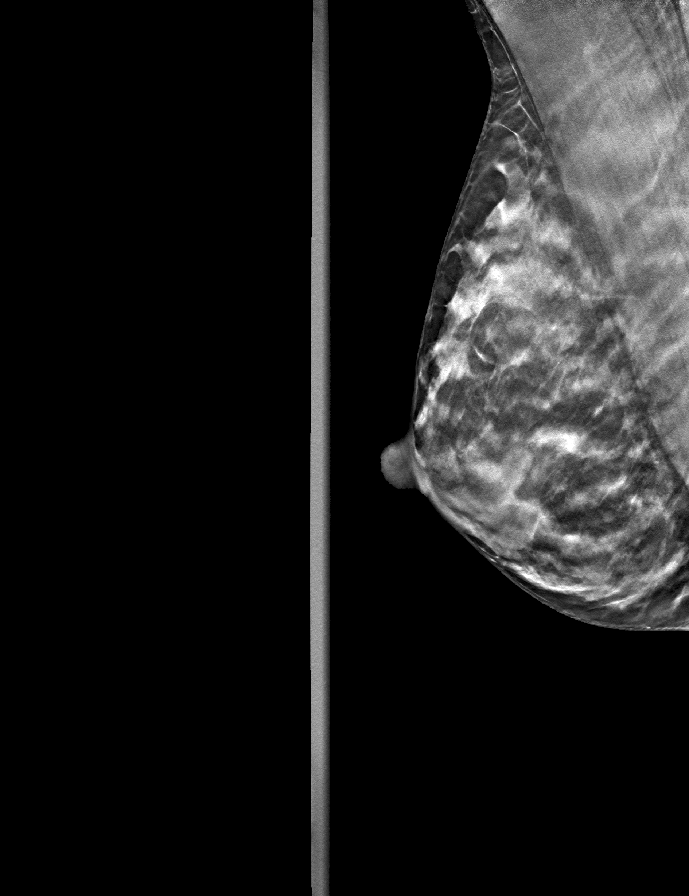
[frame 25/49]
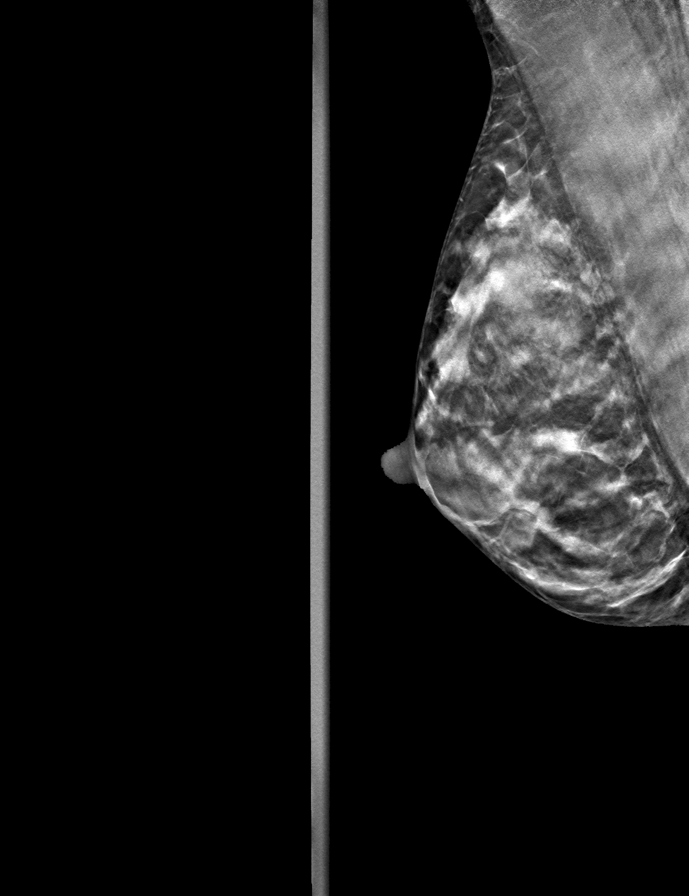

[L CC tomo · tomo slice 25/49.0]
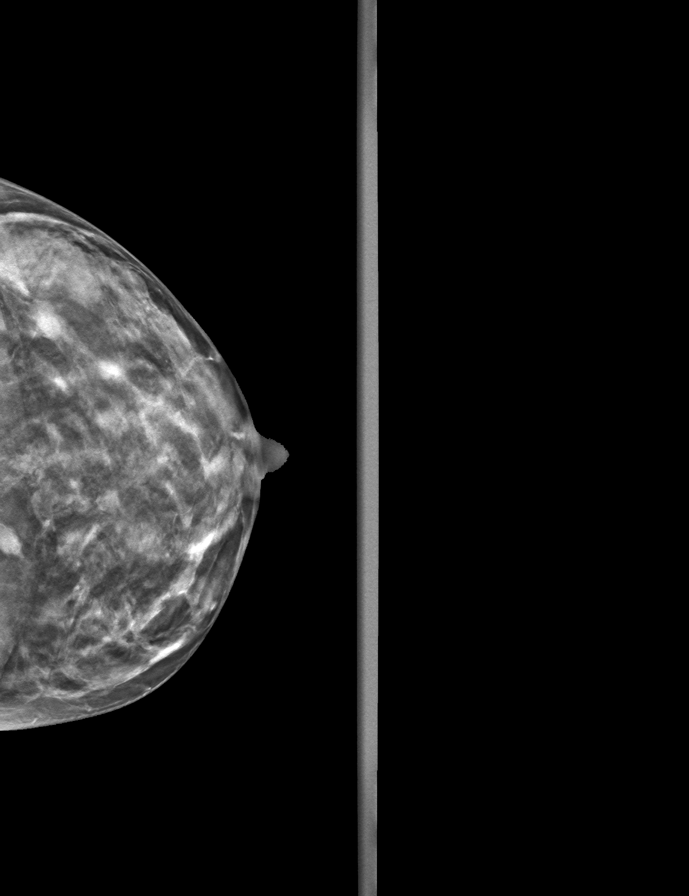

[L MLO tomo · tomo slice 25/48.0]
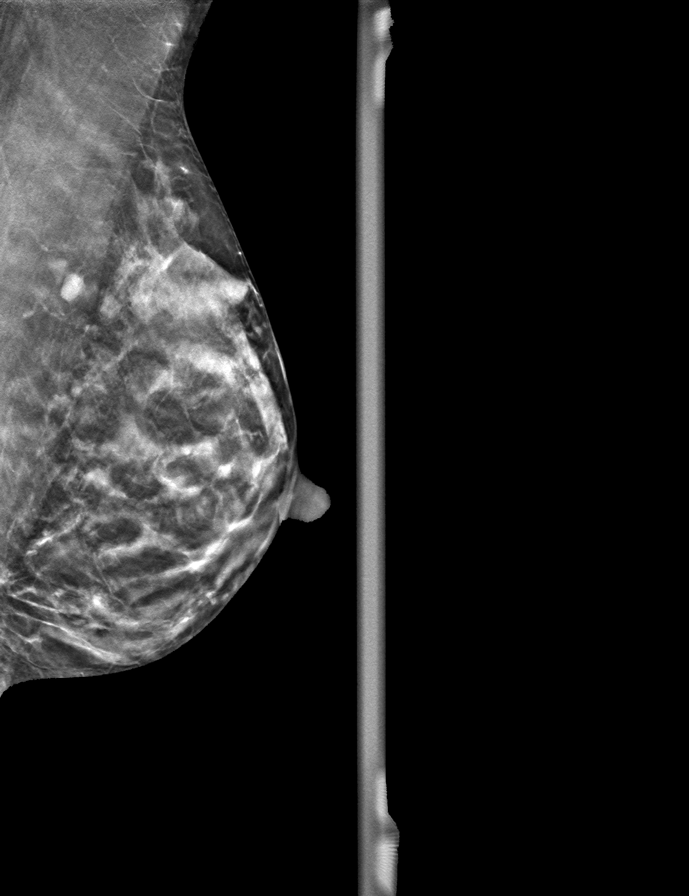

[R CC tomo · tomo slice 25/50.0]
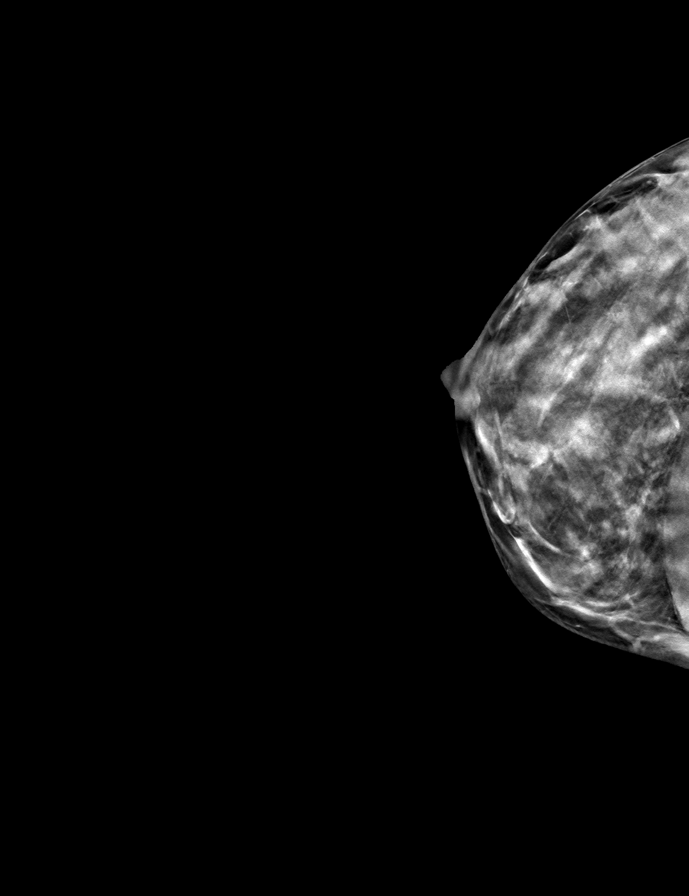

[9 of 24 positions shown; findings below may reference images not displayed]

ACR Breast Density Category c: The breast tissue is heterogeneously
dense, which may obscure small masses.
FINDINGS: In the left breast, a possible mass warrants further evaluation. In
the right breast, no findings suspicious for malignancy.
IMPRESSION: Further evaluation is suggested for a possible mass in the left
breast.

RECOMMENDATION:
Diagnostic mammogram and possibly ultrasound of the left breast.
(Code:C3-P-XXB)

The patient will be contacted regarding the findings, and additional
imaging will be scheduled.

BI-RADS CATEGORY  0: Incomplete. Need additional imaging evaluation
and/or prior mammograms for comparison.

## 2023-06-26 ENCOUNTER — Ambulatory Visit (AMBULATORY_SURGERY_CENTER): Payer: 59 | Admitting: Gastroenterology

## 2023-06-26 ENCOUNTER — Encounter: Payer: Self-pay | Admitting: Gastroenterology

## 2023-06-26 VITALS — BP 122/85 | HR 65 | Temp 99.1°F | Resp 12 | Ht 67.0 in | Wt 140.0 lb

## 2023-06-26 DIAGNOSIS — K633 Ulcer of intestine: Secondary | ICD-10-CM | POA: Diagnosis not present

## 2023-06-26 DIAGNOSIS — Z86010 Personal history of colonic polyps: Secondary | ICD-10-CM | POA: Diagnosis not present

## 2023-06-26 DIAGNOSIS — Z09 Encounter for follow-up examination after completed treatment for conditions other than malignant neoplasm: Secondary | ICD-10-CM

## 2023-06-26 DIAGNOSIS — Z1211 Encounter for screening for malignant neoplasm of colon: Secondary | ICD-10-CM | POA: Diagnosis not present

## 2023-06-26 DIAGNOSIS — K5000 Crohn's disease of small intestine without complications: Secondary | ICD-10-CM

## 2023-06-26 MED ORDER — SODIUM CHLORIDE 0.9 % IV SOLN: 500.0000 mL | INTRAVENOUS | Status: DC

## 2023-06-26 NOTE — Patient Instructions (Addendum)
Resume previous diet. Continue present medications. Await pathology results. Repeat colonoscopy in 3 years for surveillance.   YOU HAD AN ENDOSCOPIC PROCEDURE TODAY AT [facility]:   Refer to the procedure report that was given to you for any specific questions about what was found during the examination.  If the procedure report does not answer your questions, please call your gastroenterologist to clarify.  If you requested that your care partner not be given the details of your procedure findings, then the procedure report has been included in a sealed envelope for you to review at your convenience later.  YOU SHOULD EXPECT: Some feelings of bloating in the abdomen. Passage of more gas than usual.  Walking can help get rid of the air that was put into your GI tract during the procedure and reduce the bloating. If you had a lower endoscopy (such as a colonoscopy or flexible sigmoidoscopy) you may notice spotting of blood in your stool or on the toilet paper. If you underwent a bowel prep for your procedure, you may not have a normal bowel movement for a few days.  Please Note:  You might notice some irritation and congestion in your nose or some drainage.  This is from the oxygen used during your procedure.  There is no need for concern and it should clear up in a day or so.  SYMPTOMS TO REPORT IMMEDIATELY:  Following lower endoscopy (colonoscopy or flexible sigmoidoscopy):  Excessive amounts of blood in the stool  Significant tenderness or worsening of abdominal pains  Swelling of the abdomen that is new, acute  Fever of 100F or higher   For urgent or emergent issues, a gastroenterologist can be reached at any hour by calling [phone]. Do not use MyChart messaging for urgent concerns.    DIET:  We do recommend a small meal at first, but then you may proceed to your regular diet.  Drink plenty of fluids but you should avoid alcoholic beverages for 24  hours.  ACTIVITY:  You should plan to take it easy for the rest of today and you should NOT DRIVE or use heavy machinery until tomorrow (because of the sedation medicines used during the test).    FOLLOW UP: Our staff will call the number listed on your records the next business day following your procedure.  We will call around 7:15- 8:00 am to check on you and address any questions or concerns that you may have regarding the information given to you following your procedure. If we do not reach you, we will leave a message.     If any biopsies were taken you will be contacted by phone or by letter within the next 1-3 weeks.  Please call us at [phone] if you have not heard about the biopsies in 3 weeks.    SIGNATURES/CONFIDENTIALITY: You and/or your care partner have signed paperwork which will be entered into your electronic medical record.  These signatures attest to the fact that that the information above on your After Visit Summary has been reviewed and is understood.  Full responsibility of the confidentiality of this discharge information lies with you and/or your care-partner.

## 2023-06-26 NOTE — Op Note (Signed)
[facility] Patient Name: Monique Jefferson Procedure Date: 06/26/2023 2:31 PM MRN: [id number] Endoscopist: Sherilyn Cooter L. Myrtie Neither , MD, [id number] Age: 47 Referring MD:  Date of Birth: [birth year] Gender: Female Account #: [id number] Procedure:                Colonoscopy Indications:              High risk colon cancer surveillance: Personal                            history of sessile serrated colon polyp (10 mm or                            greater in size)                           SSP without dysplasia x 5 (including one > 20mm) on                            for screening colonoscopy August 2023 Medicines:                Monitored Anesthesia Care Procedure:                Pre-Anesthesia Assessment:                           - Prior to the procedure, a History and Physical                            was performed, and patient medications and                            allergies were reviewed. The patient's tolerance of                            previous anesthesia was also reviewed. The risks                            and benefits of the procedure and the sedation                            options and risks were discussed with the patient.                            All questions were answered, and informed consent                            was obtained. Prior Anticoagulants: The patient has                            taken no anticoagulant or antiplatelet agents. ASA                            Grade Assessment: I - A normal, healthy patient.  After reviewing the risks and benefits, the patient                            was deemed in satisfactory condition to undergo the                            procedure.                           After obtaining informed consent, the colonoscope                            was passed under direct vision. Throughout the                            procedure, the patient's blood pressure, pulse, and                             oxygen saturations were monitored continuously. The                            Olympus CF-HQ190L [id number]) Colonoscope was                            introduced through the anus and advanced to the the                            terminal ileum, with identification of the                            appendiceal orifice and IC valve. The colonoscopy                            was performed without difficulty. The patient                            tolerated the procedure well. The quality of the                            bowel preparation was excellent. The terminal                            ileum, ileocecal valve, appendiceal orifice, and                            rectum were photographed. Scope In: 2:49:55 PM Scope Out: 3:07:39 PM Scope Withdrawal Time: 0 hours 13 minutes 34 seconds  Total Procedure Duration: 0 hours 17 minutes 44 seconds  Findings:                 The perianal and digital rectal examinations were                            normal.  The terminal ileum contained a few small and                            shallow ulcers with otherwise normal mucosa. No                            bleeding was present. Biopsies were taken with a                            cold forceps for histology.                           Normal mucosa was found in the entire colon. No                            colon polyps seen.                           The exam was otherwise without abnormality on                            direct and retroflexion views. Complications:            No immediate complications. Estimated Blood Loss:     Estimated blood loss was minimal. 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 Impression:               - A few ulcers in the terminal ileum. Biopsied.                           - Normal mucosa in the entire examined colon.                           - The examination was otherwise normal on direct                             and retroflexion views. Recommendation:           - Patient has a contact number available for                            emergencies. The signs and symptoms of potential                            delayed complications were discussed with the                            patient. Return to normal activities tomorrow.                            Written discharge instructions were provided to the  patient.                           - Resume previous diet.                           - Continue present medications.                           - Await pathology results.                           - Repeat colonoscopy in 3 years for surveillance. Jaquell Seddon L. Myrtie Neither, MD 06/26/2023 3:16:21 PM This report has been signed electronically.

## 2023-06-26 NOTE — Progress Notes (Signed)
History and Physical:  This patient presents for endoscopic testing for: Encounter Diagnosis  Name Primary?   Personal history of colonic polyps Yes   Surveillance colonoscopy today SSP x 5 , including one 20mm in size, on first colonoscopy August 2023 Patient denies chronic abdominal pain, rectal bleeding, constipation or diarrhea.   Patient is otherwise without complaints or active issues today.   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   Current Facility-Administered Medications  Medication Dose Route Frequency Provider Last Rate Last Admin   0.9 %  sodium chloride infusion  500 mL Intravenous Continuous Charlie Pitter III, MD          ___________________________________________________________________ Objective   Exam:  BP (!) 143/104   Pulse 88   Temp 99.1 F (37.3 C) (Temporal)   Resp 11   Ht 5\' 7"  (1.702 m)   Wt 140 lb (63.5 kg)   LMP 06/18/2023 (Exact Date)   SpO2 100%   BMI 21.93 kg/m   CV: regular , S1/S2 Resp: clear to auscultation bilaterally, normal RR and effort noted GI: soft, no tenderness, with active bowel sounds.   Assessment: Encounter Diagnosis  Name Primary?   Personal history of colonic polyps Yes     Plan: Colonoscopy   The benefits and risks of the planned procedure were described in detail with the patient or (when  appropriate) their health care proxy.  Risks were outlined as including, but not limited to, bleeding, infection, perforation, adverse medication reaction leading to cardiac or pulmonary decompensation, pancreatitis (if ERCP).  The limitation of incomplete mucosal visualization was also discussed.  No guarantees or warranties were given.  The patient is appropriate for an endoscopic procedure in the ambulatory setting.   - Amada Jupiter, MD

## 2023-06-26 NOTE — Progress Notes (Signed)
Uneventful anesthetic. Report to pacu rn. Vss. Care resumed by rn. 

## 2023-06-26 NOTE — Progress Notes (Deleted)
Pt's states no medical or surgical changes since previsit or office visit. 

## 2023-06-26 NOTE — Progress Notes (Signed)
Pt's states no medical or surgical changes since previsit or office visit. 

## 2023-06-27 ENCOUNTER — Telehealth: Payer: Self-pay | Admitting: *Deleted

## 2023-06-27 NOTE — Telephone Encounter (Signed)
  Follow up Call-     06/26/2023    2:18 PM 06/07/2022    2:11 PM  Call back number  Post procedure Call Back phone  # [phone]  Permission to leave phone message Yes Yes     Patient questions:  Do you have a fever, pain , or abdominal swelling? No. Pain Score  0 *  Have you tolerated food without any problems? Yes.    Have you been able to return to your normal activities? Yes.    Do you have any questions about your discharge instructions: Diet   No. Medications  No. Follow up visit  No.  Do you have questions or concerns about your Care? No.  Actions: * If pain score is 4 or above: No action needed, pain <4.

## 2023-07-08 ENCOUNTER — Encounter: Payer: Self-pay | Admitting: Gastroenterology

## 2023-07-21 IMAGING — US US BREAST*L* LIMITED INC AXILLA
1 series · 10 of 10 positions shown · non-contrast
Comparison: Previous exams including recent screening mammogram
dated 05/09/2021.

CLINICAL DATA: Patient returns today to evaluate a possible LEFT
breast mass identified on recent screening mammogram. History of
benign LEFT breast biopsy in [id number] with pathology result of benign
fibrocystic change, adenosis and PASH.

EXAM:
DIGITAL DIAGNOSTIC UNILATERAL LEFT MAMMOGRAM WITH TOMOSYNTHESIS AND
CAD; ULTRASOUND LEFT BREAST LIMITED
TECHNIQUE: Left digital diagnostic mammography and breast tomosynthesis was
performed. The images were evaluated with computer-aided detection.;
Targeted ultrasound examination of the left breast was performed.

[Series 1: us breast*left* limited inc axilla · 0.06mm/px · 10 of 10 slices shown]
[im 1/10]
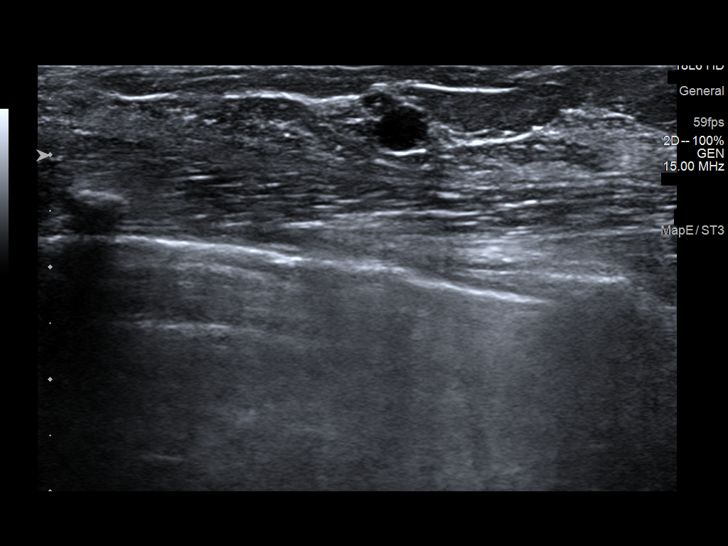
[im 2/10]
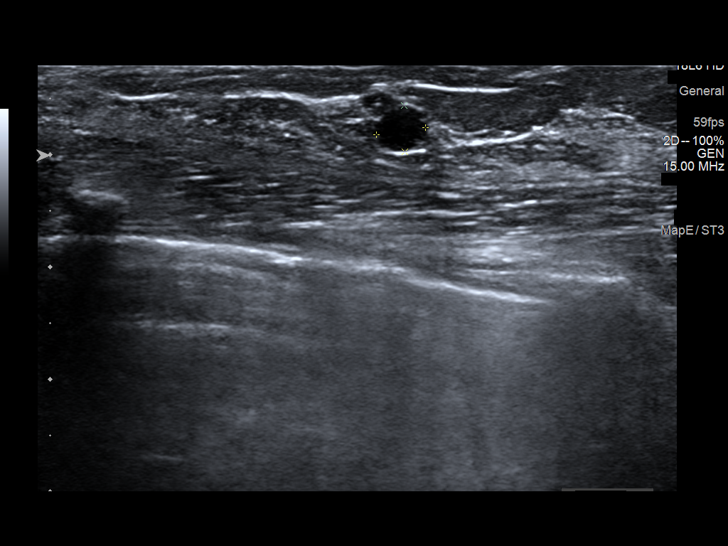
[im 3/10]
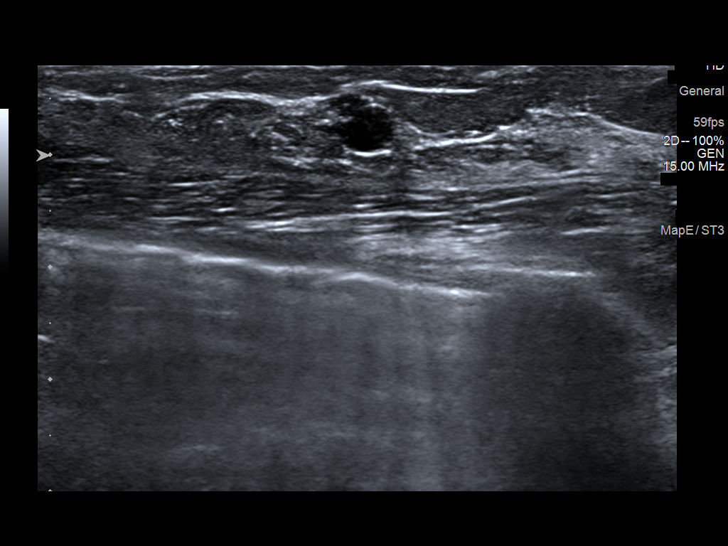
[im 4/10]
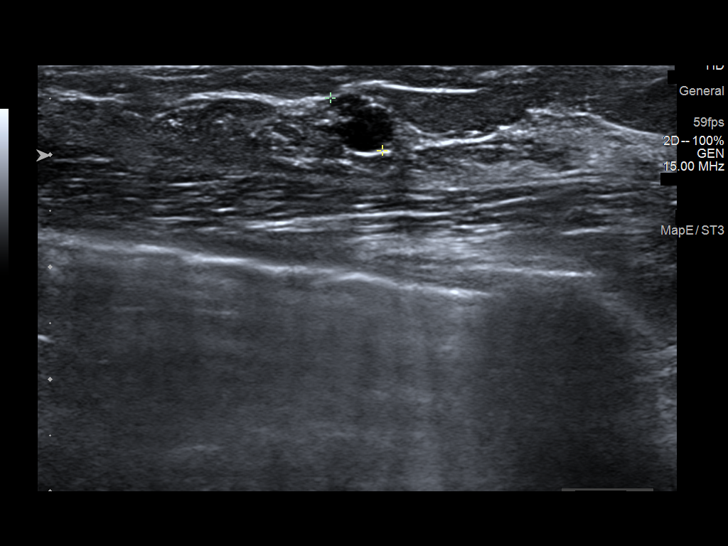
[im 5/10]
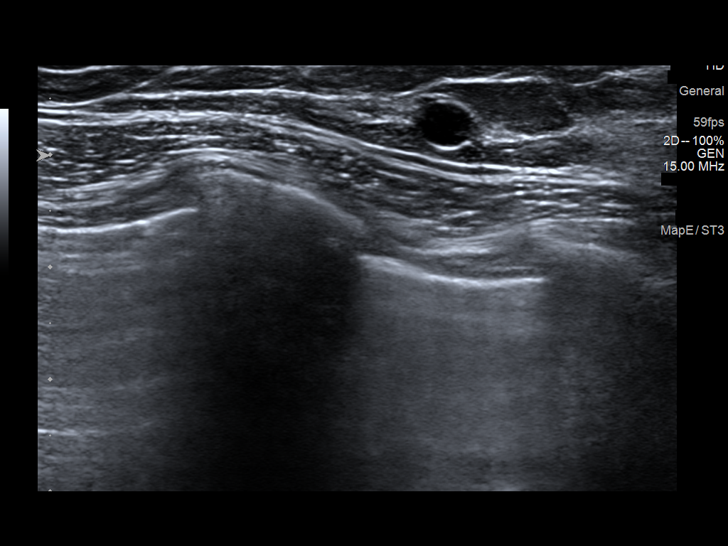
[im 6/10]
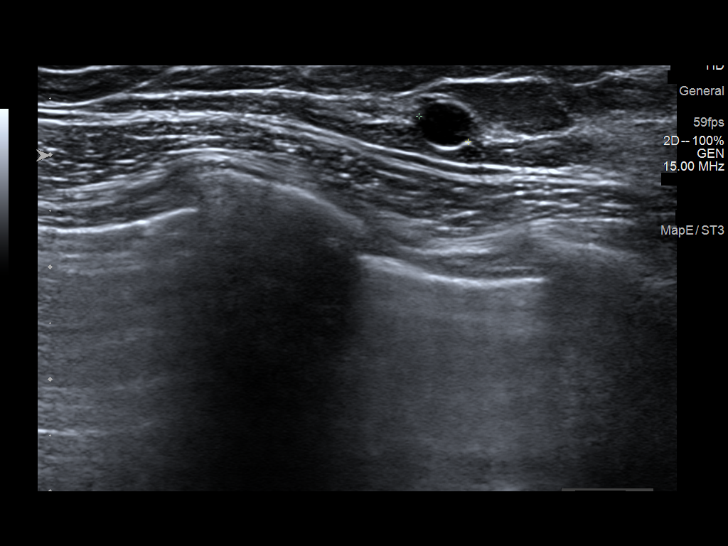
[im 7/10]
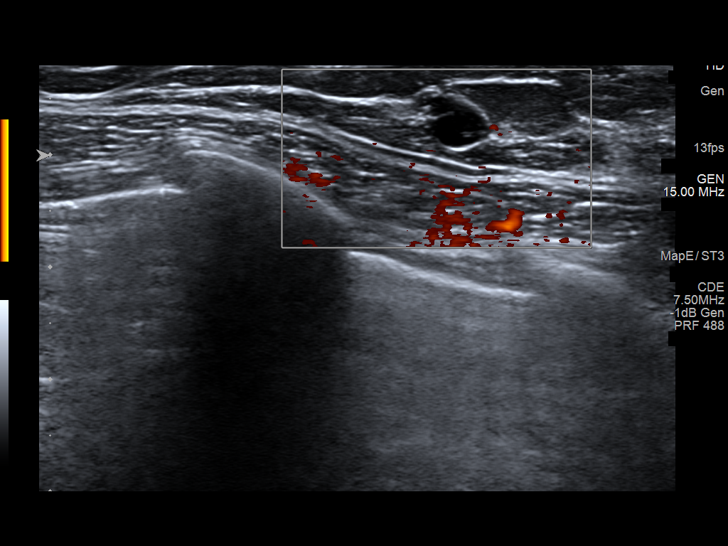
[im 8/10]
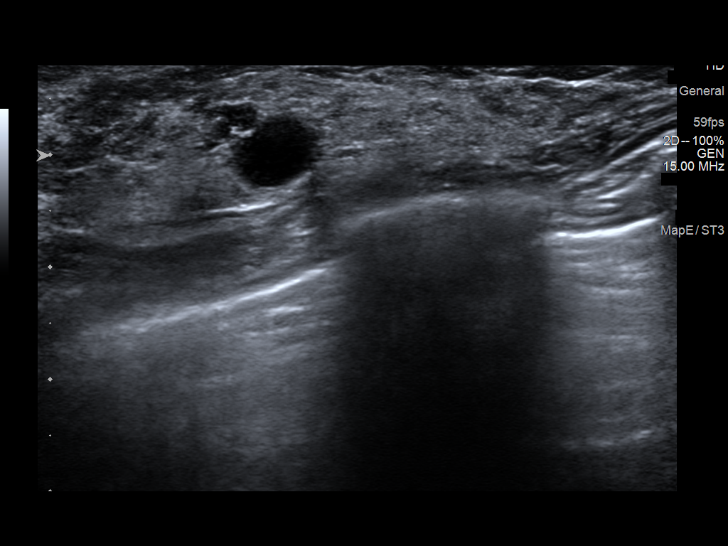
[im 9/10]
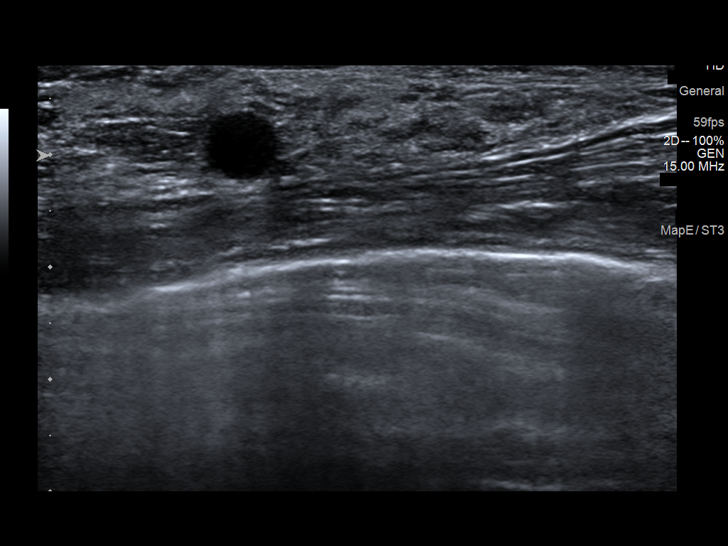
[im 10/10]
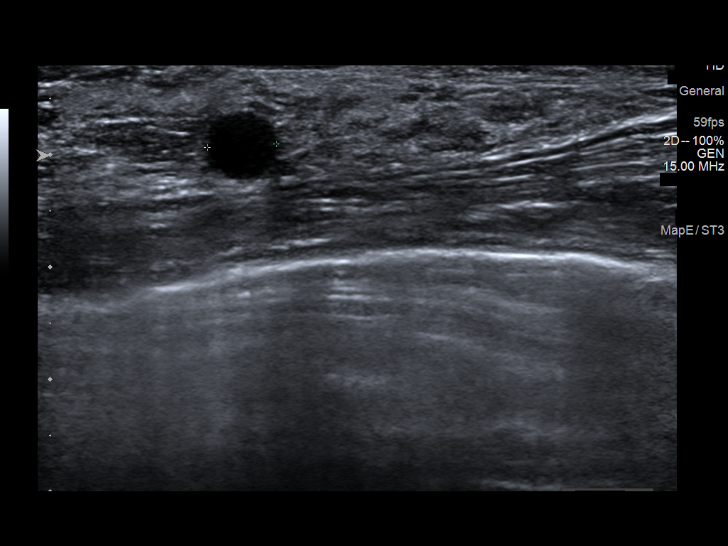

[10 of 10 positions shown; findings below may reference images not displayed]

ACR Breast Density Category c: The breast tissue is heterogeneously
dense, which may obscure small masses.
FINDINGS: On today's additional diagnostic views with spot compression and 3D
tomosynthesis, an oval circumscribed mass is confirmed within the
upper LEFT breast, 11-12 o'clock axis region, at posterior depth,
measuring approximately 8 mm greatest dimension.

Targeted ultrasound is performed, showing a benign cyst within the
LEFT breast at the 11 o'clock axis, 5 cm from the nipple, measuring
7 x 4 x 5 mm, corresponding to the mammographic finding. No
suspicious solid or cystic mass is identified within the upper LEFT
breast by ultrasound.

There is an additional benign cyst in the LEFT breast at the 2
o'clock axis, 1 cm from the nipple, measuring 6 mm, corresponding as
an incidental finding
IMPRESSION: No evidence of malignancy. Benign cyst within the LEFT breast at the
11 o'clock axis, measuring 7 mm, corresponding to the mammographic
finding.

RECOMMENDATION:
Screening mammogram in one year.(Code:M3-H-8E0)

I have discussed the findings and recommendations with the patient.
If applicable, a reminder letter will be sent to the patient
regarding the next appointment.

BI-RADS CATEGORY  2: Benign.

## 2024-07-26 ENCOUNTER — Other Ambulatory Visit: Payer: Self-pay | Admitting: Family Medicine

## 2024-07-26 DIAGNOSIS — Z1231 Encounter for screening mammogram for malignant neoplasm of breast: Secondary | ICD-10-CM

## 2024-08-02 ENCOUNTER — Ambulatory Visit
